# Patient Record
Sex: Male | Born: 1980 | Hispanic: Yes | Marital: Single | Smoking: Never smoker
Health system: Southern US, Community
[De-identification: ages and names within clinical notes are randomized; demographics above are authoritative.]

## PROBLEM LIST (undated history)

## (undated) DIAGNOSIS — E119 Type 2 diabetes mellitus without complications: Secondary | ICD-10-CM

## (undated) HISTORY — DX: Type 2 diabetes mellitus without complications: E11.9

---

## 2015-02-12 ENCOUNTER — Emergency Department (HOSPITAL_COMMUNITY): Payer: Self-pay

## 2015-02-12 ENCOUNTER — Encounter (HOSPITAL_COMMUNITY): Payer: Self-pay | Admitting: Emergency Medicine

## 2015-02-12 ENCOUNTER — Emergency Department (HOSPITAL_COMMUNITY)
Admission: EM | Admit: 2015-02-12 | Discharge: 2015-02-12 | Disposition: A | Discharge status: Home / Self Care | Payer: Self-pay | Attending: Emergency Medicine | Admitting: Emergency Medicine

## 2015-02-12 DIAGNOSIS — R739 Hyperglycemia, unspecified: Secondary | ICD-10-CM | POA: Insufficient documentation

## 2015-02-12 DIAGNOSIS — E860 Dehydration: Secondary | ICD-10-CM | POA: Insufficient documentation

## 2015-02-12 DIAGNOSIS — Z79899 Other long term (current) drug therapy: Secondary | ICD-10-CM | POA: Insufficient documentation

## 2015-02-12 DIAGNOSIS — M791 Myalgia: Secondary | ICD-10-CM | POA: Insufficient documentation

## 2015-02-12 DIAGNOSIS — R0789 Other chest pain: Secondary | ICD-10-CM | POA: Insufficient documentation

## 2015-02-12 LAB — URINALYSIS, ROUTINE W REFLEX MICROSCOPIC
Bilirubin Urine: NEGATIVE
HGB URINE DIPSTICK: NEGATIVE
Ketones, ur: NEGATIVE mg/dL
LEUKOCYTES UA: NEGATIVE
Nitrite: NEGATIVE
Protein, ur: NEGATIVE mg/dL
SPECIFIC GRAVITY, URINE: 1.043 — AB (ref 1.005–1.030)
Urobilinogen, UA: 0.2 mg/dL (ref 0.0–1.0)
pH: 5 (ref 5.0–8.0)

## 2015-02-12 LAB — CBC
HEMATOCRIT: 47.4 % (ref 39.0–52.0)
Hemoglobin: 16.9 g/dL (ref 13.0–17.0)
MCH: 30.1 pg (ref 26.0–34.0)
MCHC: 35.7 g/dL (ref 30.0–36.0)
MCV: 84.5 fL (ref 78.0–100.0)
Platelets: 178 10*3/uL (ref 150–400)
RBC: 5.61 MIL/uL (ref 4.22–5.81)
RDW: 12.3 % (ref 11.5–15.5)
WBC: 7 10*3/uL (ref 4.0–10.5)

## 2015-02-12 LAB — BASIC METABOLIC PANEL
Anion gap: 9 (ref 5–15)
BUN: 17 mg/dL (ref 6–20)
CO2: 25 mmol/L (ref 22–32)
CREATININE: 0.89 mg/dL (ref 0.61–1.24)
Calcium: 9.2 mg/dL (ref 8.9–10.3)
Chloride: 96 mmol/L — ABNORMAL LOW (ref 101–111)
GFR calc Af Amer: >60 mL/min (ref >60)
GFR calc non Af Amer: >60 mL/min (ref >60)
GLUCOSE: 559 mg/dL — AB (ref 70–99)
Potassium: 4 mmol/L (ref 3.5–5.1)
Sodium: 130 mmol/L — ABNORMAL LOW (ref 135–145)

## 2015-02-12 LAB — URINE MICROSCOPIC-ADD ON

## 2015-02-12 LAB — I-STAT TROPONIN, ED: Troponin i, poc: 0 ng/mL (ref 0.00–0.08)

## 2015-02-12 LAB — CBG MONITORING, ED: GLUCOSE-CAPILLARY: 268 mg/dL — AB (ref 70–99)

## 2015-02-12 MED ORDER — SODIUM CHLORIDE 0.9 % IV BOLUS (SEPSIS)
2000.0000 mL | Freq: Once | INTRAVENOUS | Status: AC
  Administered 2015-02-12: 2000 mL via INTRAVENOUS

## 2015-02-12 MED ORDER — INSULIN ASPART 100 UNIT/ML IV SOLN
10.0000 [IU] | Freq: Once | INTRAVENOUS | Status: AC
  Administered 2015-02-12: 10 [IU] via INTRAVENOUS
  Filled 2015-02-12: qty 1

## 2015-02-12 MED ORDER — METFORMIN HCL 500 MG PO TABS: 500.0000 mg | ORAL_TABLET | Freq: Two times a day (BID) | ORAL | Status: DC

## 2015-02-12 MED ORDER — KETOROLAC TROMETHAMINE 30 MG/ML IJ SOLN
30.0000 mg | Freq: Once | INTRAMUSCULAR | Status: AC
  Administered 2015-02-12: 30 mg via INTRAVENOUS
  Filled 2015-02-12: qty 1

## 2015-02-12 MED ORDER — IBUPROFEN 600 MG PO TABS: 600.0000 mg | ORAL_TABLET | Freq: Four times a day (QID) | ORAL | Status: DC | PRN

## 2015-02-12 NOTE — ED Provider Notes (Signed)
CSN: [id number]     Arrival date & time 02/12/15  0103 History  This chart was scribed for Loren Raceravid Ott Zimmerle, MD by Abel PrestoKara Demonbreun, ED Scribe. This patient was seen in room A04C/A04C and the patient's care was started at 3:43 AM.    Chief Complaint  Patient presents with  . Chest Pain    Patient is a 34 y.o. male presenting with chest pain. The history is provided by the patient. No language interpreter was used.  Chest Pain Associated symptoms: cough and nausea   Associated symptoms: no abdominal pain, no back pain, no dizziness, no fever, no headache, no numbness, no shortness of breath, not vomiting and no weakness    HPI Comments: Dennis Avila is a 34 y.o. male who presents to the Emergency Department complaining of chest pain with cough with onset yesterday. Pt denies pain without cough and deep inspiration. Pt notes associated fever 1 week ago which has resolved. Has had dry mouth, polydipsia, urinary frequency, and nausea. Pt denies recent surgeries and prolonged travel. Pt denies h/o DM and HTN but notes FHx of DM. He denies vomiting, SOB, and swelling or pain in bilateral LEs.    History reviewed. No pertinent past medical history. History reviewed. No pertinent past surgical history. No family history on file. History  Substance Use Topics  . Smoking status: Never Smoker   . Smokeless tobacco: Not on file  . Alcohol Use: No    Review of Systems  Constitutional: Negative for fever and chills.  Respiratory: Positive for cough. Negative for shortness of breath and wheezing.   Cardiovascular: Positive for chest pain (with cough). Negative for leg swelling.  Gastrointestinal: Positive for nausea. Negative for vomiting, abdominal pain and diarrhea.  Endocrine: Positive for polydipsia and polyuria.  Genitourinary: Positive for frequency.  Musculoskeletal: Positive for myalgias. Negative for back pain, neck pain and neck stiffness.  Skin: Negative for rash and wound.   Neurological: Negative for dizziness, syncope, weakness, light-headedness, numbness and headaches.  All other systems reviewed and are negative.     Allergies  Review of patient's allergies indicates no known allergies.  Home Medications   Prior to Admission medications   Medication Sig Start Date End Date Taking? Authorizing Provider  ibuprofen (ADVIL,MOTRIN) 600 MG tablet Take 1 tablet (600 mg total) by mouth every 6 (six) hours as needed. 02/12/15   Loren Raceravid Marrisa Kimber, MD  metFORMIN (GLUCOPHAGE) 500 MG tablet Take 1 tablet (500 mg total) by mouth 2 (two) times daily with a meal. 02/12/15   Loren Raceravid Delyle Weider, MD   BP 117/74 mmHg  Pulse 89  Temp(Src) 98.9 F (37.2 C) (Oral)  Resp 17  Ht 5\' 8"  (1.727 m)  Wt 245 lb (111.131 kg)  BMI 37.26 kg/m2  SpO2 97% Physical Exam  Constitutional: He is oriented to person, place, and time. He appears well-developed and well-nourished. No distress.  HENT:  Head: Normocephalic and atraumatic.  Mouth/Throat: Oropharynx is clear and moist.  Eyes: Conjunctivae and EOM are normal. Pupils are equal, round, and reactive to light.  Neck: Normal range of motion. Neck supple.  Cardiovascular: Normal rate and regular rhythm.   Pulmonary/Chest: Effort normal and breath sounds normal. No respiratory distress. He has no wheezes. He has no rales. He exhibits tenderness (chest pain is reproduced with palpation over the left side of the chest. There is no crepitance or deformity).  Abdominal: Soft. Bowel sounds are normal. He exhibits no distension and no mass. There is no tenderness. There is no rebound  and no guarding.  Musculoskeletal: Normal range of motion. He exhibits no edema or tenderness.  No calf swelling or tenderness.  Neurological: He is alert and oriented to person, place, and time.  Moves all extremities without deficit. Sensation is grossly intact.  Skin: Skin is warm and dry. No rash noted. No erythema.  Psychiatric: He has a normal mood and  affect. His behavior is normal.  Nursing note and vitals reviewed.   ED Course  Procedures (including critical care time) DIAGNOSTIC STUDIES: Oxygen Saturation is 96% on room air, normal by my interpretation.    COORDINATION OF CARE: 3:50 AM Discussed treatment plan with patient at beside, the patient agrees with the plan and has no further questions at this time.   Labs Review Labs Reviewed  BASIC METABOLIC PANEL - Abnormal; Notable for the following:    Sodium 130 (*)    Chloride 96 (*)    Glucose, Bld 559 (*)    All other components within normal limits  URINALYSIS, ROUTINE W REFLEX MICROSCOPIC - Abnormal; Notable for the following:    Specific Gravity, Urine 1.043 (*)    Glucose, UA >1000 (*)    All other components within normal limits  CBG MONITORING, ED - Abnormal; Notable for the following:    Glucose-Capillary 268 (*)    All other components within normal limits  CBC  URINE MICROSCOPIC-ADD ON  I-STAT TROPOININ, ED  CBG MONITORING, ED    Imaging Review Dg Chest 2 View  02/12/2015   CLINICAL DATA:  Nausea and vomiting and dizziness for 1 week. Left-sided chest pain.  EXAM: CHEST  2 VIEW  COMPARISON:  None.  FINDINGS: Normal cardiac silhouette. Low lung volumes. No effusion, infiltrate, or pneumothorax.  IMPRESSION: Low lung volumes.  No acute cardiopulmonary process.   Electronically Signed   By: Genevive Bi M.D.   On: 02/12/2015 02:13     EKG Interpretation   Date/Time:  Monday Feb 12 2015 01:10:10 EDT Ventricular Rate:  111 PR Interval:  160 QRS Duration: 86 QT Interval:  322 QTC Calculation: 437 R Axis:   86 Text Interpretation:  Sinus tachycardia Otherwise normal ECG Confirmed by  Cannon Arreola  MD, Oracio Galen ([id number]) on 02/12/2015 6:31:27 AM      MDM   Final diagnoses:  Left-sided chest wall pain  Hyperglycemia  Dehydration   I personally performed the services described in this documentation, which was scribed in my presence. The recorded information  has been reviewed and is accurate.  Patient presents with constant left-sided chest pain for one day. He has a normal troponin and an EKG without ischemic changes. His chest pain is completely reproduced with palpation of the left chest. No evidence of infection on x-ray. Patient states the pain is improved after Toradol. Was also noted to have significantly elevated blood sugar. Patient was tachycardic on presentation. He received 2 L normal saline and IV insulin in the emergency department. His tachycardia has resolved with IV fluids. His blood sugars down to the 200s. I have thoroughly educated the patient about the need for close follow-up through interpreter. We'll refer to the health and wellness clinic. Discuss with hospitalist Dr. Allena Katz. Recommend starting metformin 500 mg twice a day. Patient has been given thorough return precautions and is voiced understanding.   Loren Racer, MD 02/12/15 [phone]

## 2015-02-12 NOTE — ED Notes (Signed)
C/o nausea, dizziness, and not feeling well x 1 week.  Reports L sided chest pressure that started Sunday.  Denies sob.  Denies cough.

## 2015-02-12 NOTE — Discharge Instructions (Signed)
Make sure you are drinking plenty of fluids. Take ibuprofen as needed for chest wall pain. Call and make an appointment to follow-up with the health and wellness clinic. If you have worsening symptoms including worsening chest pain, shortness of breath, fever, lightheadedness, persistent vomiting, abdominal pain or any concerns return immediately to the emergency department.    Dolor de la pared torcica (Chest Wall Pain) Dolor en la pared torcica es dolor en o alrededor de los huesos y msculos de su pecho. Podrn pasar hasta 6 semanas hasta que comience a mejorar. Puede demorar ms tiempo si es fsicamente activo en su Aleen Campitrabajo y Toledoactividades.  CAUSAS  El dolor en el pecho puede aparecer sin motivo. No obstante, algunas causas pueden ser:   Neomia DearUna enfermedad viral como la gripe.  Traumatismos.  Tos.  La prctica de ejercicios.  Artritis.  Fibromialgia  Culebrilla. INSTRUCCIONES PARA EL CUIDADO DOMICILIARIO  Evite hacer actividad fsica extenuante. Trate de no esforzarse o Electrical engineerrealizar actividades que le causen dolor. Aqu se incluyen las actividades en las que [place]ausa los msculos del trax, los abdominales y los msculos laterales, especialmente si debe levantar objetos pesados.  Aplique hielo sobre la zona dolorida.  Ponga el hielo en una bolsa plstica.  Colquese una toalla entre la piel y la bolsa de hielo.  Deje la bolsa de hielo durante 15 a 20 minutos por hora, durante los primeros [address]  [address].  Utilice los medicamentos de venta libre o de prescripcin para Chief Technology Officerel dolor, Environmental health practitionerel malestar o la Milbridgefiebre, segn se lo indique el profesional que lo asiste. SOLICITE ATENCIN MDICA DE INMEDIATO SI:  El dolor aumenta o siente muchas molestias.  Tiene fiebre.  El dolor de Braddyvillepecho empeora.  Desarrolla nuevos e inexplicables sntomas.  Tiene nuseas o vmitos.  Berenice Primasranspira o se siente mareado.  Tiene tos con flema (esputo), o tose con sangre. EST SEGURO QUE:   Comprende las instrucciones para  el alta mdica.  Controlar su enfermedad.  Solicitar atencin mdica de inmediato segn las indicaciones. Document Released: 11/10/2006 Document Revised: 12/22/2011 [facility] Patient Information 2015 [place], [place]. This information is not intended to replace advice given to you by your health care provider. Make sure you discuss any questions you have with your health care provider.  Deshidratacin, Adulto (Dehydration, Adult) Se denomina deshidratacin cuando se pierden ms lquidos de los que se ingieren. Los rganos Navistar International Corporationvitales como los riones, el cerebro y el corazn no pueden funcionar sin la [place] Jan Mayen Islandsadecuada cantidad de lquidos y Airline pilotsales. Cualquier prdida de lquidos del organismo puede causar deshidratacin.  CAUSAS  Vmitos.  Diarrea.  Sudoracin excesiva.  Eliminacin excesiva de orina.  Grant RutsFiebre. SNTOMAS Deshidratacin leve  Sed.  Labios resecos.  Sequedad leve de la mucosa bucal. Deshidratacin moderada   La boca est muy seca.  Ojos hundidos.  La piel no vuelve rpidamente a su lugar cuando se suelta luego de pellizcarla ligeramente.  Larose Kellsrina oscura y disminucin de la produccin de Comor[place]orina.  Disminucin de la cantidad de lgrimas.  Dolor de Turkmenistancabeza. Deshidratacin grave  La boca est muy seca.  Sed extrema.  Pulso rpido y dbil (ms de 100 por minuto en reposo).  Manos y pies fros.  Prdida de la capacidad para transpirar, independientemente del calor y la temperatura.  Respiracin rpida.  Labios azulados.  Confusin y Health and safety inspectoraletargamiento.  Dificultad para despertarse.  Poca produccin de [place]or[place].  Falta de lgrimas. DIAGNSTICO El profesional diagnosticar deshidratacin basndose en los sntomas y el examen fsico. Las pruebas de sangre y [place]orina ayudarn a Astronomerconfirmar  el diagnstico. La evaluacin diagnstica suele identificar tambin las causas de la deshidratacin. TRATAMIENTO El tratamiento de la deshidratacin leve o moderada generalmente puede  hacerse en el hogar mediante el aumento de la cantidad de los lquidos que se beben. Es mejor beber pequeas cantidades de lquidos con mayor frecuencia. Beber demasiado de una sola vez puede hacer que el vmito empeore. Siga las instrucciones para el cuidado en el hogar que se indican a continuacin.  La deshidratacin grave debe tratarse en el hospital en el que probablemente le administren lquidos por va intravenosa (IV) que contienen agua y Customer service manager.  INSTRUCCIONES PARA EL CUIDADO DOMICILIARIO  Pida instrucciones especficas a su mdico con respecto a la rehidratacin.  Debe ingerir gran cantidad de lquido para mantener la orina de tono claro o color amarillo plido.  Beba pequeas cantidades con frecuencia si tiene nuseas y vmitos.  Alimntese como lo hace normalmente.  Evite:  Alimentos o bebidas que Energy manager.  Gaseosas.  Jugos.  Lquidos muy calientes o fros.  Bebidas con cafena.  Alimentos muy grasos.  Alcohol.  Lowella Dell demasiado a la vez.  Postres de gelatina.  Lave bien sus manos para evitar las bacterias o los virus se diseminen.  Slo tome medicamentos de venta libre o recetados para Primary school teacher, las molestias o bajar la fiebre segn las indicaciones de su mdico.  Consulte a su mdico si puede seguir tomando todos sus medicamentos recetados o de Sales promotion account executive.  Concurra puntualmente a las citas de control con el mdico. SOLICITE ATENCIN MDICA SI:  Tiene dolor abdominal y este aumenta o permanece en un rea (se localiza).  Aparece una erupcin, rigidez en el cuello o dolor de cabeza intensos.  Est irritable, somnoliento o le cuesta despertarse.  Se siente dbil, mareado tiene mucha sed. SOLICITE ATENCIN MDICA DE INMEDIATO SI:  No puede retener lquidos o empeora a pesar del tratamiento.  Tiene episodios frecuentes de vmitos o diarrea.  Observa sangre o una sustancia verde (bilis) en el vmito.  Maxie Better en la materia fecal, o las heces son negras y de aspecto alquitranado.  No ha orinado durante 6 a 8 horas, o slo ha [place] cantidad [place] de [place].  Tiene fiebre.  Se desmaya. ASEGRESE QUE:  Comprende estas instrucciones.  Controlar su enfermedad.  Solicitar ayuda inmediatamente si no mejora o si empeora. Document Released: 09/29/2005 Document Revised: 12/22/2011 [facility] Patient Information 2015 [place], [place]. This information is not intended to replace advice given to you by your health care provider. Make sure you discuss any questions you have with your health care provider.  Hiperglucemia (Hyperglycemia) La hiperglucemia ocurre cuando la glucosa (azcar) en su sangre est demasiado elevada. Puede suceder por varias razones, pero a menudo ocurre en personas que no saben que tienen diabetes o no la controlan adecuadamente.  CAUSAS Tanto si tiene diabetes como si no, existen otras causas para la hiperglucemia. La hiperglucemia puede producirse cuando tiene diabetes, pero tambin puede presentarse en otras situaciones de las que podra no ser consciente, como por ejemplo: Diabetes  Si tiene diabetes y tiene problemas para controlar su glucosa en sangre, la hiperglucemia podra producirse debido a las siguientes razones:  No seguir Press photographer.  No tomar los medicamentos para la diabetes o tomarlos de forma inadecuada.  Realizar menos ejercicio del que normalmente hace.  Estar enfermo. Prediabetes  Esto no puede ignorarse. Antes de que la persona presente diabetes de tipo 2, casi siempre hay "  prediabetes". Esto ocurre cuando su glucosa en sangre es mayor que lo normal, pero no lo suficiente como para diagnosticar diabetes. La investigacin ha demostrado que algunos daos al cuerpo de Air cabin crew, en especial los del corazn y el sistema circulatorio, podran haber ocurrido durante el periodo de prediabetes. Si controla la glucosa en sangre  cuando tiene prediabetes, podr retardar o evitar que se desarrrolle la diabetes tipo 2. El estrs  Si tiene diabetes, deber hacer una dieta, tomar medicamentos orales o insulina para [place]. Sin embargo, Clinical research associate que la glucosa en sangre es mayor que lo normal en el hospital tenga o no diabetes. Cientficamente se lo denomina "hiperglucemia por estrs". El estrs puede elevar su glucosa en sangre. Esto ocurre porque el organismo genera hormonas en los momentos de estrs. Si el estrs ha [employer] causa del alto nivel de glucosa en [place], [place] mdico podr Education officer, environmental un seguimiento de [place] regular. Feliberto Harts, podr asegurarse de que la hiperglucemia no empeora o progresa hacia diabetes. Esteroides  Los esteroides son medicamentos que actan en la infeccin que ataca al sistema inmunolgico para bloquear la inflamacin o la infeccin. Un efecto secundario puede ser el aumento de glucosa en [place]. Muchas personas pueden producir la suficiente insulina extra para este aumento, pero aquellos que no pueden, los esteroides pueden [employer] niveles sean an [place]. No es inusual que los tratamientos con esteorides "destapen" una diabetes que se est desarrollando. No siempre es posible determinar si la hiperglucemia desaparecer una vez que se detenga el consumo de esteroides. A veces se realiza un anlisis de sangre especial denominado A1c para determinar si la glucosa en sangre se ha elevado antes de comenzar con el consumo de esteroides. SNTOMAS  Sed.  Necesidad frecuente de Geographical information systems officer.  [employer].  Visin borrosa.  Cansancio o fatiga.  Debilidad.  Somnolencia.  Hormigueo en el pie o pierna. DIAGNSTICO El diagnstico se realiza mediante el control de la glucosa en sangre de una o varias de las siguientes maneras:  Anlisis A1c. Es una sustancia qumica que se encuentra en la [place].  Control de glucosa en sangre con tiras de prueba.  Resultados de  laboratorio. TRATAMIENTO Primero, es importante conocer la causa de la hiperglucemia antes de tratarla. El tratamiento puede ser el siguiente, [place] pueden ser otros:  Educacin  Cambios o ajustes en los medicamentos.  Cambios o ajustes en el plan de alimentacin.  Tratamiento por enfermedades, infecciones, etc.  Control de glucosa en sangre ms frecuente.  Cambios en el plan de ejercicios.  Disminucin o interrupcin del consumo de esteroides.  Cambios en el estilo de vida. INSTRUCCIONES PARA EL CUIDADO DOMICILIARIO  Contrlese la glucosa en sangre, como se lo indicaron.  Haga ejercicios regularmente. El profesional que lo asiste le dar instrucciones relacionadas con el ejercicio fsico. La prediabetes que es consecuencia de situaciones de estrs, puede mejorar con la actividad fsica.  Consuma alimentos saludables y balanceados. Coma a menudo y de [place] regular, en momentos fijos. El profesional o el nutricionista le dar una dieta especial para controlar su ingestin de azcar.  Mantener su peso ideal es importante. Si lo necesita, perder un poco de peso, como 5  7 Kg. puede ser beneficioso para [employer] niveles de Production assistant, radio. SOLICITE ATENCIN MDICA SI:  Tiene preguntas relacionadas con los medicamentos, la actividad o la dieta.  Contina teniendo sntomas (como mucha sed, deseos intensos de Geographical information systems officer o aumento de peso) SOLICITE ATENCIN MDICA DE INMEDIATO SI:  Vomita o  tiene diarrea.  Su respiracin huele frutal.  La frecuencia respiratoria es ms rpida o ms lenta.  Est somnoliento o incoherente.  Siente adormecimiento, hormigueos o Tax adviser o en las manos.  Siente dolor en el pecho.  Sus sntomas empeoran aunque haya seguido las indicaciones de su mdico.  Tiene otras preguntas o preocupaciones. Document Released: 09/29/2005 Document Revised: 12/22/2011 [facility] Patient Information 2015 [place], [place]. This information is not intended  to replace advice given to you by your health care provider. Make sure you discuss any questions you have with your health care provider.

## 2015-03-14 ENCOUNTER — Ambulatory Visit (INDEPENDENT_AMBULATORY_CARE_PROVIDER_SITE_OTHER): Payer: Self-pay | Admitting: Physician Assistant

## 2015-03-14 ENCOUNTER — Encounter: Payer: Self-pay | Admitting: Physician Assistant

## 2015-03-14 VITALS — BP 126/80 | HR 84 | Temp 98.7°F | Resp 18 | Ht 69.5 in | Wt 247.0 lb

## 2015-03-14 DIAGNOSIS — IMO0002 Reserved for concepts with insufficient information to code with codable children: Secondary | ICD-10-CM

## 2015-03-14 DIAGNOSIS — E1165 Type 2 diabetes mellitus with hyperglycemia: Secondary | ICD-10-CM

## 2015-03-14 DIAGNOSIS — E784 Other hyperlipidemia: Secondary | ICD-10-CM

## 2015-03-14 DIAGNOSIS — E785 Hyperlipidemia, unspecified: Secondary | ICD-10-CM

## 2015-03-14 LAB — LIPID PANEL
CHOL/HDL RATIO: 6.2 ratio
Cholesterol: 180 mg/dL (ref 0–200)
HDL: 29 mg/dL — ABNORMAL LOW (ref >=40)
LDL Cholesterol: 111 mg/dL — ABNORMAL HIGH (ref 0–99)
Triglycerides: 202 mg/dL — ABNORMAL HIGH (ref <150)
VLDL: 40 mg/dL (ref 0–40)

## 2015-03-14 LAB — POCT GLYCOSYLATED HEMOGLOBIN (HGB A1C): HEMOGLOBIN A1C: 10

## 2015-03-14 MED ORDER — METFORMIN HCL 1000 MG PO TABS: 1000.0000 mg | ORAL_TABLET | Freq: Two times a day (BID) | ORAL | Status: DC

## 2015-03-14 MED ORDER — PRAVASTATIN SODIUM 40 MG PO TABS: 40.0000 mg | ORAL_TABLET | Freq: Every day | ORAL | Status: DC

## 2015-03-14 NOTE — Progress Notes (Deleted)
   Name: Dennis Avila MRN: [id number] DOB: 02/09/1981  Subjective HPI: Pt presents to clinic today for   Patient Active Problem List   Diagnosis Date Noted  . DM (diabetes mellitus), type 2, uncontrolled 03/14/2015   Current Outpatient Prescriptions on File Prior to Visit  Medication Sig Dispense Refill  . metFORMIN (GLUCOPHAGE) 500 MG tablet Take 1 tablet (500 mg total) by mouth 2 (two) times daily with a meal. 60 tablet 0  . ibuprofen (ADVIL,MOTRIN) 600 MG tablet Take 1 tablet (600 mg total) by mouth every 6 (six) hours as needed. (Patient not taking: Reported on 03/14/2015) 30 tablet 0   No current facility-administered medications on file prior to visit.   History reviewed. No pertinent past medical history.   No Known Allergies  ROS  Objective: BP 126/80 mmHg  Pulse 84  Temp(Src) 98.7 F (37.1 C) (Oral)  Resp 18  Ht 5' 9.5" (1.765 m)  Wt 247 lb (112.038 kg)  BMI 35.96 kg/m2  SpO2 98%  Physical Exam  Constitutional: He appears well-developed and well-nourished.    Assessment and Plan: DM (diabetes mellitus), type 2, uncontrolled - Plan: Lipid panel, POCT glycosylated hemoglobin (Hb A1C), HM DIABETES FOOT EXAM  Duquan Gillooly, PA-S [facility] 03/14/2015 10:00 AM

## 2015-03-14 NOTE — Patient Instructions (Signed)
Diabetes y actividad fsica (Diabetes and Exercise) Hacer actividad fsica con regularidad es muy importante. No se trata solo de perder peso. Tiene muchos otros beneficios, como por ejemplo:  Mejorar el estado fsico, la flexibilidad y la resistencia.  Aumenta la densidad sea.  Ayuda a controlar el peso.  Disminuye la grasa corporal.  Aumenta la fuerza muscular.  Reduce el estrs y las tensiones.  Mejora el estado de salud general. Las personas diabticas que realizan actividad fsica tienen beneficios adicionales debido al ejercicio:  Reduce el apetito.  El organismo mejora el uso del azcar (glucosa) de la sangre.  Ayuda a disminuir o controlar la glucosa en la sangre.  Disminuye la presin arterial.  Ayuda a disminuir los lpidos en la sangre (colesterol y triglicridos).  El organismo mejora el uso de la insulina porque:  Aumenta la sensibilidad del organismo a la insulina.  Reduce las necesidades de insulina del organismo.  Disminuye el riesgo de enfermedad cardaca por la actividad fsica ya que  disminuye el colesterol y tambin los triglicridos.  Aumenta los niveles de colesterol bueno (como las lipoprotenas de alta densidad [HDL]) en el organismo.  Disminuye los niveles de glucosa en la sangre. SU PLAN DE ACTIVIDAD  Elija una actividad que disfrute y establezca objetivos realistas. Su mdico o educador en diabetes podrn ayudarlo a encontrar una actividad que lo beneficie. Haga ejercicio regularmente como se lo haya indicado el mdico. Esto incluye:  Hacer entrenamiento de resistencia dos veces a la semana, como flexiones, sentadillas, levantar peso o usar bandas de resistencia.  Practicar 150minutos de ejercicios cardiovasculares cada semana, como caminar, correr o hacer algn deporte.  Mantenerse activo y no permanecer inactivo durante ms de 90minutos seguidos. Los perodos cortos de actividad tambin son beneficiosos. Tres sesiones de 10minutos a  lo largo del da son tan beneficiosas como una sola sesin de 30minutos. Estas son algunas ideas para los ejercicios:  Lleve a pasear el perro.  Utilice las escaleras en lugar del ascensor.  Baile su cancin favorita.  Haga los ejercicios de un video de ejercicios.  Haga sus ejercicios favoritos con un amigo. RECOMENDACIONES PARA REALIZAR EJERCICIOS CUANDO SE TIENE DIABETES TIPO 1 O TIPO 2   Controle la glucosa en la sangre antes de comenzar. Si el nivel de glucosa en la sangre es de ms de 240 mg/dl, controle las cetonas en la orina. No haga actividad fsica si hay cetonas.  Evite inyectarse insulina en las zonas del cuerpo que ejercitar. Por ejemplo, evite inyectarse insulina en:  Los brazos, si juega al tenis.  Las piernas, si corre.  Lleve un registro de:  Los alimentos que consume antes y despus de hacer el ejercicio.  Los momentos esperables de picos de accin de la insulina.  Los niveles de glucosa en la sangre antes y despus de hacer ejercicios.  El tipo y cantidad de actividad fsica que realiza.  Revise los registros con su mdico. El mdico lo ayudar a desarrollar pautas para ajustar la cantidad de alimento y las cantidades de insulina antes y despus de hacer ejercicios.  Si toma insulina o agentes hipoglucemiantes por va oral, observe si hay signos y sntomas de hipoglucemia. Entre los que se incluyen:  Mareos.  Temblores.  Sudoracin.  Escalofros.  Confusin.  Beba gran cantidad de agua mientras hace ejercicios para evitar la deshidratacin o los golpes de calor. Durante la actividad fsica se pierde agua corporal que se debe reponer.  Comente con su mdico antes de comenzar un programa de   actividad fsica para verificar que sea seguro para usted. Recuerde, cualquier actividad es mejor que ninguna. Document Released: 10/19/2007 Document Revised: 02/13/2014 ExitCare Patient Information 2015 [employer]. This information is not intended to  replace advice given to you by your health care provider. Make sure you discuss any questions you have with your health care provider.  

## 2015-03-14 NOTE — Progress Notes (Signed)
Dennis Avila  MRN: [id number] DOB: 12/27/1980  Subjective:  Pt presents to clinic for a diabetes check.  He was seen in the ED on 5/2 due to dizziness and he was diagnosed with DM and started on Metformin 500mg  bid.  He has felt much better since then.  He has been tolerating the medication ok.  He does not check his glucose.  Diet is poor, a lot of juice and carbs based diet.  Almost no exercise.    Girlfried in room who interprets for patient, they do not live together.  Patient Active Problem List   Diagnosis Date Noted  . DM (diabetes mellitus), type 2, uncontrolled 03/14/2015   History reviewed. No pertinent past medical history.  The patient has a current medication list which includes the following prescription(s): metformin and ibuprofen.  No Known Allergies  Review of Systems  Cardiovascular: Negative for chest pain.  Neurological: Negative for dizziness and sensory change.    Objective:  BP 126/80 mmHg  Pulse 84  Temp(Src) 98.7 F (37.1 C) (Oral)  Resp 18  Ht 5' 9.5" (1.765 m)  Wt 247 lb (112.038 kg)  BMI 35.96 kg/m2  SpO2 98%   Physical Exam  Constitutional: He is oriented to person, place, and time and well-developed, well-nourished, and in no distress.  BP 126/80 mmHg  Pulse 84  Temp(Src) 98.7 F (37.1 C) (Oral)  Resp 18  Ht 5' 9.5" (1.765 m)  Wt 247 lb (112.038 kg)  BMI 35.96 kg/m2  SpO2 98%   HENT:  Head: Normocephalic and atraumatic.  Right Ear: External ear normal.  Left Ear: External ear normal.  Eyes: Conjunctivae are normal.  Neck: Normal range of motion.  Cardiovascular: Normal rate, regular rhythm and normal heart sounds.   Pulmonary/Chest: Effort normal and breath sounds normal.  Neurological: He is alert and oriented to person, place, and time.  Skin: Skin is warm and dry.  Diabetic Foot Exam - Simple   Simple Foot Form  Diabetic Foot exam was performed with the following findings:  Yes  03/14/2015  9:59 AM  Visual Inspection    No deformities, no ulcerations, no other skin breakdown bilaterally:  Yes  Sensation Testing  Intact to touch and monofilament testing bilaterally:  Yes  Pulse Check  Posterior Tibialis and Dorsalis pulse intact bilaterally:  Yes  Comments      Psychiatric: Mood, memory, affect and judgment normal.   Results for orders placed or performed in visit on 03/14/15  Lipid panel  Result Value Ref Range   Cholesterol 180 0 - 200 mg/dL   Triglycerides 409202 (H) <150 mg/dL   HDL 29 (L) >=81>=40 mg/dL   Total CHOL/HDL Ratio 6.2 Ratio   VLDL 40 0 - 40 mg/dL   LDL Cholesterol 191111 (H) 0 - 99 mg/dL  POCT glycosylated hemoglobin (Hb A1C)  Result Value Ref Range   Hemoglobin A1C 10.0     Assessment and Plan :  DM (diabetes mellitus), type 2, uncontrolled - Plan: Lipid panel, POCT glycosylated hemoglobin (Hb A1C), HM DIABETES FOOT EXAM, metFORMIN (GLUCOPHAGE) 1000 MG tablet  We will recheck in 3 months.  We talked about ways to change his diet and things to avoid or at least cut down.  He drinks a lot of juice and eats sweets.  He was not interested in DM nutrition therapy at this time.  He understands how to increase his medications.  Due to his low HDL we will go ahead and start a statin.  I did not start an ACE today due to his good BP and I did not want to overwhelm the patient with his new diagnosis.  This was the 1st time he was educated about diabetes and I expect his numbers to be improved at his next visit with the education we were able to do today.  Benny Lennert PA-C  [facility] 03/14/2015 5:12 PM

## 2015-04-05 ENCOUNTER — Ambulatory Visit: Payer: Self-pay | Attending: Internal Medicine

## 2015-06-12 ENCOUNTER — Ambulatory Visit (INDEPENDENT_AMBULATORY_CARE_PROVIDER_SITE_OTHER): Payer: Self-pay | Admitting: Family Medicine

## 2015-06-12 VITALS — BP 120/78 | HR 95 | Temp 98.3°F | Resp 18 | Ht 69.0 in | Wt 258.8 lb

## 2015-06-12 DIAGNOSIS — E784 Other hyperlipidemia: Secondary | ICD-10-CM

## 2015-06-12 DIAGNOSIS — Z2821 Immunization not carried out because of patient refusal: Secondary | ICD-10-CM

## 2015-06-12 DIAGNOSIS — E1165 Type 2 diabetes mellitus with hyperglycemia: Secondary | ICD-10-CM

## 2015-06-12 DIAGNOSIS — E785 Hyperlipidemia, unspecified: Secondary | ICD-10-CM

## 2015-06-12 DIAGNOSIS — E119 Type 2 diabetes mellitus without complications: Secondary | ICD-10-CM

## 2015-06-12 LAB — COMPLETE METABOLIC PANEL WITH GFR
ALT: 90 U/L — ABNORMAL HIGH (ref 9–46)
AST: 41 U/L — ABNORMAL HIGH (ref 10–40)
Albumin: 4.7 g/dL (ref 3.6–5.1)
BUN: 17 mg/dL (ref 7–25)
CO2: 25 mmol/L (ref 20–31)
Chloride: 103 mmol/L (ref 98–110)
GFR, Est Non African American: >89 mL/min (ref >=60)
Glucose, Bld: 134 mg/dL — ABNORMAL HIGH (ref 65–99)
Potassium: 4 mmol/L (ref 3.5–5.3)
Sodium: 138 mmol/L (ref 135–146)
Total Bilirubin: 0.5 mg/dL (ref 0.2–1.2)

## 2015-06-12 LAB — COMPLETE METABOLIC PANEL WITHOUT GFR
Alkaline Phosphatase: 70 U/L (ref 40–115)
Calcium: 9.5 mg/dL (ref 8.6–10.3)
Creat: 0.85 mg/dL (ref 0.60–1.35)
GFR, Est African American: >89 mL/min (ref >=60)
Total Protein: 7.4 g/dL (ref 6.1–8.1)

## 2015-06-12 LAB — POCT GLYCOSYLATED HEMOGLOBIN (HGB A1C): Hemoglobin A1C: 6.7

## 2015-06-12 MED ORDER — PRAVASTATIN SODIUM 40 MG PO TABS: 40.0000 mg | ORAL_TABLET | Freq: Every day | ORAL | Status: DC

## 2015-06-12 MED ORDER — METFORMIN HCL 1000 MG PO TABS: 1000.0000 mg | ORAL_TABLET | Freq: Two times a day (BID) | ORAL | Status: DC

## 2015-06-12 NOTE — Patient Instructions (Signed)
Diabetes y normas bsicas de atencin mdica (Diabetes and Standards of Medical Care) La diabetes es una enfermedad complicada. El equipo que trate su diabetes deber incluir un nutricionista, un enfermero, un educador para la diabetes, un oftalmlogo y ms. Para que todas las personas conozcan sobre su enfermedad y para que los pacientes tengan los cuidados que necesitan, se crearon las siguientes normas bsicas para un mejor control. A continuacin se indican los estudios, vacunas, medicamentos, educacin y planes que necesitar. Prueba de HbA1c Esta prueba muestra cmo ha sido controlada su glucosa en los ltimos 2 o 3 meses. Se utiliza para verificar si el plan de control de la diabetes debe ser ajustado.   Hgalos al menos 2 veces al ao si cumple los objetivos del tratamiento.  Si le han cambiado el tratamiento o si no cumple con los objetivos del tratamiento, debe hacerlo 4 veces al ao. Control de la presin arterial.  Hgalas en cada visita mdica de rutina. El objetivo es tener menos de 140/90 mm Hg en la mayora de las personas, pero 130/80 mm Hg en algunos casos. Consulte a su mdico acerca de su objetivo. Examen dental.  Concurra regularmente a las visitas de control con el dentista. Examen ocular.  Si le diagnosticaron diabetes tipo 1 siendo un nio, debe hacerse estudios al llegar a los 10 aos o ms y si ha sufrido de diabetes durante 3 a 5 aos. Se recomienda hacer anualmente los exmenes oculares despus de ese examen inicial.  Si le diagnosticaron diabetes tipo 1 siendo adulto, hgase un examen dentro de los 5 aos del diagnstico y luego una vez por ao.  Si le diagnosticaron diabetes tipo 2, hgase un estudio lo antes posible despus del diagnstico y luego una vez por ao. Examen de los pies  Se har una inspeccin visual en cada visita mdica de rutina. Estos controles observarn si hay cortes, lesiones u otros problemas en los pies.  Una vez por ao debe hacerse un  examen ms intensivo. Este examen incluye la inspeccin visual y una evaluacin de los pulsos del pie y la sensibilidad.  Contrlese los pies todas las noches para ver si hay cortes, lesiones u otros problemas. Comunquese con su mdico si observa que no se curan. Estudio de la funcin renal (microalbmina en orina)  Debe realizarse una vez por ao.  Diabetes tipo 1: La primera prueba se realiza a los 5 aos despus del diagnstico.  Diabetes tipo2: La primera prueba se realiza en el momento del diagnstico.  La creatinina srica y el ndice de filtracin glomerular estimada (eGFR, por sus siglas en ingls) se realizan una vez por ao para informar el nivel de enfermedad renal crnica, si la hubiera. Perfil lipdico (colesterol, HDL, LDL, triglicridos).  La mayora de las personas lo hacen cada 5 aos.  En relacin al LDL, el objetivo es tener menos de 100 mg/dl. Si tiene alto riesgo, el objetivo es tener menos de 70 mg/dl.  En relacin al HDL, el objetivo es tener entre 40 y 50 mg/dl para los hombres y entre 50 y 60 mg/dl para las mujeres. Un nivel de colesterol HDL de 60 mg/dl o superior da una cierta proteccin contra la enfermedad cardaca.  En relacin a los triglicridos, el objetivo es tener menos de 150 mg/dl. Vacuna contra la gripe, contra el neumococo y contra la hepatitis B  Se recomienda aplicarse la vacuna contra la gripe anualmente.  Se recomienda que las personas mayores de 65aos con diabetes se apliquen la   vacuna contra la neumona. En algunos casos, pueden administrarse dos inyecciones separadas. Pregntele al mdico si tiene la vacuna contra la neumona al da.  Tambin se recomienda la aplicacin de la vacuna contra la hepatitis B en los adultos con diabetes. Educacin para el autocontrol de la diabetes  Recomendaciones al momento del diagnstico y los controles segn sea necesario. Plan de tratamiento  Su plan de tratamiento ser revisado en cada visita  mdica. Document Released: 12/24/2009 Document Revised: 02/13/2014 ExitCare Patient Information 2015 [employer]. This information is not intended to replace advice given to you by your health care provider. Make sure you discuss any questions you have with your health care provider.  

## 2015-06-12 NOTE — Progress Notes (Signed)
Chief Complaint:  Chief Complaint  Patient presents with  . Diabetes Check  . Medication Refill    Metformin    HPI: Dennis Avila is a 34 y.o. male who reports to [facility] today complaining of diabetes checkup. He is doing well . NO complaints. Eating less, same foods, no sodas, not exercising except at work. Not utd on eye exam  Denies neuropathy, CP, SOB, hypoglycemia, palpitations, presyncope/syncope, polydipsia, polyuria, nausea, vomiting, abd pain  Wt Readings from Last 3 Encounters:  06/12/15 258 lb 12.8 oz (117.391 kg)  03/14/15 247 lb (112.038 kg)  02/12/15 245 lb (111.131 kg)   Lab Results  Component Value Date   HGBA1C 6.7 06/12/2015   HGBA1C 10.0 03/14/2015   Lab Results  Component Value Date   LDLCALC 111* 03/14/2015   CREATININE 0.89 02/12/2015      Past Medical History  Diagnosis Date  . Diabetes mellitus without complication    History reviewed. No pertinent past surgical history. Social History   Social History  . Marital Status: Single    Spouse Name: N/A  . Number of Children: N/A  . Years of Education: N/A   Occupational History  . Psychiatric nurse    Social History Main Topics  . Smoking status: Never Smoker   . Smokeless tobacco: None  . Alcohol Use: No  . Drug Use: No  . Sexual Activity: Not Asked   Other Topics Concern  . None   Social History Narrative   Psychiatric nurse - Sports coach   Family History  Problem Relation Age of Onset  . Diabetes Mother    No Known Allergies Prior to Admission medications   Medication Sig Start Date End Date Taking? Authorizing Provider  metFORMIN (GLUCOPHAGE) 1000 MG tablet Take 1 tablet (1,000 mg total) by mouth 2 (two) times daily with a meal. 03/14/15  Yes Morrell Riddle, PA-C  pravastatin (PRAVACHOL) 40 MG tablet Take 1 tablet (40 mg total) by mouth daily. 03/14/15  Yes Morrell Riddle, PA-C  ibuprofen (ADVIL,MOTRIN) 600 MG tablet Take 1 tablet (600 mg total) by mouth every 6 (six)  hours as needed. Patient not taking: Reported on 03/14/2015 02/12/15   Loren Racer, MD     ROS: The patient denies fevers, chills, night sweats, unintentional weight loss, chest pain, palpitations, wheezing, dyspnea on exertion, nausea, vomiting, abdominal pain, dysuria, hematuria, melena, numbness, weakness, or tingling.   All other systems have been reviewed and were otherwise negative with the exception of those mentioned in the HPI and as above.    PHYSICAL EXAM: Filed Vitals:   06/12/15 0823  BP: 120/78  Pulse: 95  Temp: 98.3 F (36.8 C)  Resp: 18   Body mass index is 38.2 kg/(m^2).   General: Alert, no acute distress HEENT:  Normocephalic, atraumatic, oropharynx patent. EOMI, PERRLA Fundo exam nor,al Cardiovascular:  Regular rate and rhythm, no rubs murmurs or gallops.  No Carotid bruits, radial pulse intact. No pedal edema.  Respiratory: Clear to auscultation bilaterally.  No wheezes, rales, or rhonchi.  No cyanosis, no use of accessory musculature Abdominal: No organomegaly, abdomen is soft and non-tender, positive bowel sounds. No masses. Skin: No rashes. Neurologic: Facial musculature symmetric. Microfilament exam normal Psychiatric: Patient acts appropriately throughout our interaction. Lymphatic: No cervical or submandibular lymphadenopathy Musculoskeletal: Gait intact. No edema, tenderness   LABS: Results for orders placed or performed in visit on 06/12/15  POCT glycosylated hemoglobin (Hb A1C)  Result Value Ref Range  Hemoglobin A1C 6.7      EKG/XRAY:   Primary read interpreted by Dr. Conley Rolls at [facility].   ASSESSMENT/PLAN: Encounter Diagnoses  Name Primary?  . Type 2 diabetes mellitus without complication Yes  . Dyslipidemia (high LDL; low HDL)   . Influenza vaccination declined    34 y/o hispanic male with DM, slightly elevated cholesterol who is here for med refills, doing well on meds without SE, compliant. Hba1c imrpoved, will return in 3 months for  fasting blood work Recommend : ADA diet, BP goal <140/90, daily foot exams, tobacco cessation if smoking, annual eye exam, annual flu vaccine, PNA vaccine if age and time appropriate.  Refill meds for 9 month, labs pending Fu in 3 months  Gross sideeffects, risk and benefits, and alternatives of medications d/w patient. Patient is aware that all medications have potential sideeffects and we are unable to predict every sideeffect or drug-drug interaction that may occur.  Amira Podolak DO  06/12/2015 9:21 AM

## 2015-06-14 ENCOUNTER — Ambulatory Visit: Payer: Self-pay | Admitting: Physician Assistant

## 2015-06-15 ENCOUNTER — Encounter: Payer: Self-pay | Admitting: Family Medicine

## 2016-01-22 ENCOUNTER — Encounter (HOSPITAL_COMMUNITY): Payer: Self-pay | Admitting: Emergency Medicine

## 2016-01-22 DIAGNOSIS — Z79899 Other long term (current) drug therapy: Secondary | ICD-10-CM | POA: Insufficient documentation

## 2016-01-22 DIAGNOSIS — R1030 Lower abdominal pain, unspecified: Secondary | ICD-10-CM | POA: Insufficient documentation

## 2016-01-22 DIAGNOSIS — A6002 Herpesviral infection of other male genital organs: Secondary | ICD-10-CM | POA: Insufficient documentation

## 2016-01-22 DIAGNOSIS — E119 Type 2 diabetes mellitus without complications: Secondary | ICD-10-CM | POA: Insufficient documentation

## 2016-01-22 DIAGNOSIS — Z7984 Long term (current) use of oral hypoglycemic drugs: Secondary | ICD-10-CM | POA: Insufficient documentation

## 2016-01-22 LAB — URINALYSIS, ROUTINE W REFLEX MICROSCOPIC
BILIRUBIN URINE: NEGATIVE
Glucose, UA: 500 mg/dL — AB
Hgb urine dipstick: NEGATIVE
Ketones, ur: 15 mg/dL — AB
Leukocytes, UA: NEGATIVE
NITRITE: NEGATIVE
Protein, ur: 30 mg/dL — AB
SPECIFIC GRAVITY, URINE: 1.038 — AB (ref 1.005–1.030)
pH: 5.5 (ref 5.0–8.0)

## 2016-01-22 LAB — CBC
HCT: 46.8 % (ref 39.0–52.0)
HEMOGLOBIN: 15.7 g/dL (ref 13.0–17.0)
MCH: 29.3 pg (ref 26.0–34.0)
MCHC: 33.5 g/dL (ref 30.0–36.0)
MCV: 87.5 fL (ref 78.0–100.0)
Platelets: 185 10*3/uL (ref 150–400)
RBC: 5.35 MIL/uL (ref 4.22–5.81)
RDW: 12.8 % (ref 11.5–15.5)
WBC: 7.4 10*3/uL (ref 4.0–10.5)

## 2016-01-22 NOTE — ED Notes (Signed)
Pt. reports intermittent low abdominal pain onset Sunday this week , denies emesis or diarrhea , no fever or chills.

## 2016-01-23 ENCOUNTER — Emergency Department (HOSPITAL_COMMUNITY): Payer: Self-pay

## 2016-01-23 ENCOUNTER — Emergency Department (HOSPITAL_COMMUNITY)
Admission: EM | Admit: 2016-01-23 | Discharge: 2016-01-23 | Disposition: A | Discharge status: Home / Self Care | Payer: Self-pay | Attending: Emergency Medicine | Admitting: Emergency Medicine

## 2016-01-23 ENCOUNTER — Encounter (HOSPITAL_COMMUNITY): Payer: Self-pay | Admitting: Radiology

## 2016-01-23 DIAGNOSIS — A6000 Herpesviral infection of urogenital system, unspecified: Secondary | ICD-10-CM

## 2016-01-23 DIAGNOSIS — R1030 Lower abdominal pain, unspecified: Secondary | ICD-10-CM

## 2016-01-23 LAB — GC/CHLAMYDIA PROBE AMP (~~LOC~~) NOT AT ARMC
CHLAMYDIA, DNA PROBE: NEGATIVE
NEISSERIA GONORRHEA: NEGATIVE

## 2016-01-23 LAB — COMPREHENSIVE METABOLIC PANEL
ALBUMIN: 4 g/dL (ref 3.5–5.0)
ALT: 105 U/L — AB (ref 17–63)
AST: 56 U/L — ABNORMAL HIGH (ref 15–41)
Alkaline Phosphatase: 67 U/L (ref 38–126)
Anion gap: 9 (ref 5–15)
BUN: 12 mg/dL (ref 6–20)
CO2: 26 mmol/L (ref 22–32)
CREATININE: 0.81 mg/dL (ref 0.61–1.24)
Calcium: 9.2 mg/dL (ref 8.9–10.3)
Chloride: 103 mmol/L (ref 101–111)
GFR calc non Af Amer: >60 mL/min (ref >60)
GLUCOSE: 263 mg/dL — AB (ref 65–99)
Potassium: 3.9 mmol/L (ref 3.5–5.1)
SODIUM: 138 mmol/L (ref 135–145)
Total Bilirubin: 0.6 mg/dL (ref 0.3–1.2)
Total Protein: 7.1 g/dL (ref 6.5–8.1)

## 2016-01-23 LAB — LIPASE, BLOOD: LIPASE: 27 U/L (ref 11–51)

## 2016-01-23 LAB — URINE MICROSCOPIC-ADD ON

## 2016-01-23 MED ORDER — OXYCODONE-ACETAMINOPHEN 5-325 MG PO TABS
1.0000 | ORAL_TABLET | Freq: Once | ORAL | Status: AC
  Administered 2016-01-23: 1 via ORAL
  Filled 2016-01-23: qty 1

## 2016-01-23 MED ORDER — ACYCLOVIR 400 MG PO TABS: 200.0000 mg | ORAL_TABLET | Freq: Every day | ORAL | Status: DC

## 2016-01-23 MED ORDER — AZITHROMYCIN 250 MG PO TABS
1000.0000 mg | ORAL_TABLET | Freq: Once | ORAL | Status: AC
  Administered 2016-01-23: 1000 mg via ORAL
  Filled 2016-01-23: qty 4

## 2016-01-23 MED ORDER — CEFTRIAXONE SODIUM 250 MG IJ SOLR
250.0000 mg | Freq: Once | INTRAMUSCULAR | Status: AC
  Administered 2016-01-23: 250 mg via INTRAMUSCULAR
  Filled 2016-01-23: qty 250

## 2016-01-23 NOTE — ED Provider Notes (Signed)
CSN: [id number]     Arrival date & time 01/22/16  2316 History   By signing my name below, I, Arlan OrganAshley Leger, attest that this documentation has been prepared under the direction and in the presence of Shon Batonourtney F Chukwuebuka Churchill, MD.  Electronically Signed: Arlan OrganAshley Leger, ED Scribe. 01/23/2016. 3:06 AM.   Chief Complaint  Patient presents with  . Abdominal Pain   The history is provided by the patient. A language interpreter was used.    HPI Comments: Karo Arrie Easternlonso Kyllonen is a 35 y.o. male with a PMHx of DM who presents to the Emergency Department complaining of intermittent, ongoing lower abdominal pain x 2 days. Currently pain rated 5/10. No aggravating or alleviating factors reported. Pt also reports dysuria and "bumps" to groin area. No OTC medications or home remedies attempted prior to arrival. No recent fever, chills, nausea, vomiting, chest pain, diarrhea, or shortness of breath. Pt denies any new sexual partners. However, he does have concern for STIs and admits to having unprotected sex. He does not take any every day medications. No known allergies to medications.  PCP: No PCP Per Patient    Past Medical History  Diagnosis Date  . Diabetes mellitus without complication (HCC)    History reviewed. No pertinent past surgical history. Family History  Problem Relation Age of Onset  . Diabetes Mother    Social History  Substance Use Topics  . Smoking status: Never Smoker   . Smokeless tobacco: None  . Alcohol Use: No    Review of Systems  Constitutional: Negative for fever and chills.  Gastrointestinal: Positive for abdominal pain. Negative for nausea, vomiting and diarrhea.  Genitourinary: Positive for dysuria.  Neurological: Negative for headaches.  Psychiatric/Behavioral: Negative for confusion.  All other systems reviewed and are negative.     Allergies  Review of patient's allergies indicates no known allergies.  Home Medications   Prior to Admission medications    Medication Sig Start Date End Date Taking? Authorizing Provider  metFORMIN (GLUCOPHAGE) 1000 MG tablet Take 1 tablet (1,000 mg total) by mouth 2 (two) times daily with a meal. 06/12/15  Yes Thao P Le, DO  pravastatin (PRAVACHOL) 40 MG tablet Take 1 tablet (40 mg total) by mouth daily. 06/12/15  Yes Thao P Le, DO  acyclovir (ZOVIRAX) 400 MG tablet Take 0.5 tablets (200 mg total) by mouth 5 (five) times daily. 01/23/16   Shon Batonourtney F Micalah Cabezas, MD  ibuprofen (ADVIL,MOTRIN) 600 MG tablet Take 1 tablet (600 mg total) by mouth every 6 (six) hours as needed. Patient not taking: Reported on 03/14/2015 02/12/15   Loren Raceravid Yelverton, MD   Triage Vitals: BP 129/88 mmHg  Pulse 82  Temp(Src) 97.9 F (36.6 C) (Oral)  Resp 15  Ht 5\' 9"  (1.753 m)  Wt 254 lb 8 oz (115.44 kg)  BMI 37.57 kg/m2  SpO2 93%   Physical Exam  Constitutional: He is oriented to person, place, and time. He appears well-developed and well-nourished. No distress.  HENT:  Head: Normocephalic and atraumatic.  Cardiovascular: Normal rate, regular rhythm and normal heart sounds.   No murmur heard. Pulmonary/Chest: Effort normal and breath sounds normal. No respiratory distress. He has no wheezes.  Abdominal: Soft. Bowel sounds are normal. He exhibits no mass. There is no tenderness. There is no rebound and no guarding.  Genitourinary:  Uncircumcised penis, small vesicles noted over the glans the penis and the distal foreskin, no ulcerative lesions noted, no discharge  Musculoskeletal: He exhibits no edema.  Neurological: He is  alert and oriented to person, place, and time.  Skin: Skin is warm and dry.  Psychiatric: He has a normal mood and affect.  Nursing note and vitals reviewed.   ED Course  Procedures (including critical care time)  DIAGNOSTIC STUDIES: Oxygen Saturation is 99% on RA, Normal by my interpretation.    COORDINATION OF CARE: 2:38 AM- Will give Percocet, Rocephin, and Zithromax. Discussed treatment plan with pt at bedside  and pt agreed to plan.     Labs Review Labs Reviewed  COMPREHENSIVE METABOLIC PANEL - Abnormal; Notable for the following:    Glucose, Bld 263 (*)    AST 56 (*)    ALT 105 (*)    All other components within normal limits  URINALYSIS, ROUTINE W REFLEX MICROSCOPIC (NOT AT [facility]) - Abnormal; Notable for the following:    Specific Gravity, Urine 1.038 (*)    Glucose, UA 500 (*)    Ketones, ur 15 (*)    Protein, ur 30 (*)    All other components within normal limits  URINE MICROSCOPIC-ADD ON - Abnormal; Notable for the following:    Squamous Epithelial / LPF 0-5 (*)    Bacteria, UA FEW (*)    Crystals CA OXALATE CRYSTALS (*)    All other components within normal limits  LIPASE, BLOOD  CBC  GC/CHLAMYDIA PROBE AMP ([place]) NOT AT [facility]    Imaging Review Ct Renal Stone Study  01/23/2016  CLINICAL DATA:  Intermittent lower abdominal pain for 3 days. History of diabetes. EXAM: CT ABDOMEN AND PELVIS WITHOUT CONTRAST TECHNIQUE: Multidetector CT imaging of the abdomen and pelvis was performed following the standard protocol without IV contrast. COMPARISON:  None. FINDINGS: LUNG BASES: Included view of the lung bases are clear. The visualized heart and pericardium are unremarkable. KIDNEYS/BLADDER: Kidneys are orthotopic, demonstrating normal size and morphology. No nephrolithiasis, hydronephrosis; limited assessment for renal masses on this nonenhanced examination. The unopacified ureters are normal in course and caliber. Urinary bladder is partially distended and unremarkable. SOLID ORGANS: The liver is diffusely hypodense compatible with steatosis with mild focal fatty sparing about the gallbladder fossa. Spleen, gallbladder, pancreas and adrenal glands are unremarkable for this non-contrast examination. GASTROINTESTINAL TRACT: The stomach, small and large bowel are normal in course and caliber without inflammatory changes, the sensitivity may be decreased by lack of enteric contrast. Normal  appendix. PERITONEUM/RETROPERITONEUM: Aortoiliac vessels are normal in course and caliber. No lymphadenopathy by CT size criteria. Prostate size is normal. No intraperitoneal free fluid nor free air. SOFT TISSUES/ OSSEOUS STRUCTURES: Nonsuspicious. Degenerative change of spine results in moderate to severe LEFT T10-11 neural foraminal narrowing. IMPRESSION: No urolithiasis, obstructive uropathy nor acute intra-abdominal/ pelvic process. Normal appendix. Hepatic steatosis. Electronically Signed   By: Awilda Metro M.D.   On: 01/23/2016 04:18   I have personally reviewed and evaluated these images and lab results as part of my medical decision-making.   EKG Interpretation None      MDM   Final diagnoses:  Genital herpes  Lower abdominal pain    Patient presents with abdominal pain and concerns for new lesions on the penis. Lesions are characteristic of herpes. He reports unprotected sex. Patient was tested for [facility]. He was empirically treated. Regarding his abdominal pain is been indolent and waxing and waning. He has no tenderness on exam. Given urinary symptoms, kidney stones is consideration. CT scan obtained and reassuring without any evidence of stones or any other intra-abdominal pathology. Patient did vomit once after taking medications for  STDs.  Otherwise he remains nontoxic. Will discharge home with acyclovir. He has a follow-up appointment on April 13.  After history, exam, and medical workup I feel the patient has been appropriately medically screened and is safe for discharge home. Pertinent diagnoses were discussed with the patient. Patient was given return precautions.  I personally performed the services described in this documentation, which was scribed in my presence. The recorded information has been reviewed and is accurate.   Shon Baton, MD 01/23/16 [phone]

## 2016-01-23 NOTE — Discharge Instructions (Signed)
Dolor abdominal en adultos (Abdominal Pain, Adult) El dolor puede tener muchas causas. Normalmente la causa del dolor abdominal no es una enfermedad y Scientist, clinical (histocompatibility and immunogenetics)mejorar sin TEFL teachertratamiento. Frecuentemente puede controlarse y tratarse en casa. Su mdico le Medical sales representativerealizar un examen fsico y posiblemente solicite anlisis de sangre y radiografas para ayudar a Chief Strategy Officerdeterminar la gravedad de su dolor. Sin embargo, en IAC/InterActiveCorpmuchos casos, debe transcurrir ms tiempo antes de que se pueda Clinical research associateencontrar una causa evidente del dolor. Antes de llegar a ese punto, es posible que su mdico no sepa si necesita ms pruebas o un tratamiento ms profundo. INSTRUCCIONES PARA EL CUIDADO EN EL HOGAR  Est atento al dolor para ver si hay cambios. Las siguientes indicaciones ayudarn a Architectural technologistaliviar cualquier molestia que pueda sentir:  [place] solo medicamentos de venta libre o recetados, segn las indicaciones del mdico.  No tome laxantes a menos que se lo haya indicado su mdico.  Pruebe con Neomia Dearuna dieta lquida absoluta (caldo, t o agua) segn se lo indique su mdico. Introduzca gradualmente una dieta normal, segn su tolerancia. SOLICITE ATENCIN MDICA SI:  Tiene dolor abdominal sin explicacin.  Tiene dolor abdominal relacionado con nuseas o diarrea.  Tiene dolor cuando orina o defeca.  Experimenta dolor abdominal que lo despierta de noche.  Tiene dolor abdominal que empeora o mejora cuando come alimentos.  Tiene dolor abdominal que empeora cuando come alimentos grasosos.  Tiene fiebre. SOLICITE ATENCIN MDICA DE INMEDIATO SI:   El dolor no desaparece en un plazo mximo de 2horas.  No deja de (vomitar).  El Engineer, miningdolor se siente solo en partes del abdomen, como el lado derecho o la parte inferior izquierda del abdomen.  Evaca materia fecal sanguinolenta o negra, de aspecto alquitranado. ASEGRESE DE QUE:  Comprende estas instrucciones.  Controlar su afeccin.  Recibir ayuda de inmediato si no mejora o si empeora.   Esta  informacin no tiene Theme park managercomo fin reemplazar el consejo del mdico. Asegrese de hacerle al mdico cualquier pregunta que tenga.   Document Released: 09/29/2005 Document Revised: 10/20/2014 Elsevier Interactive Patient Education 2016 Elsevier Inc. Herpes genital (Genital Herpes) El herpes genital es una enfermedad de transmisin sexual (ETS) comn causada por un virus. El virus se propaga de Neomia Dearuna persona a otra a travs del contacto sexual. La infeccin puede causar picazn, ampollas y llagas en la zona genital o rectal. Esto se denomina erupcin. Se presenta en hombres y mujeres. El herpes genital es particularmente preocupante para las mujeres embarazadas porque el virus puede transmitirse al beb durante el parto y causar problemas graves. El herpes genital tambin es una preocupacin para las personas con un sistema de defensa (inmunitario) debilitado. Los sntomas del herpes genital pueden durar varios das y Tech Data Corporationluego desaparecer. Sin embargo, el virus NVR Incpermanece en el cuerpo, de modo que es posible que tenga ms erupciones en el futuro. El Bank of Americatiempo entre las erupciones vara: pueden transcurrir meses o aos. CAUSAS El herpes genital es causado por el virus del herpes simple (VHS) tipo2 o el VHS tipo1. Estos virus son contagiosos y, por lo general, se transmiten a travs del contacto sexual con Neomia Dearuna persona infectada. El contacto sexual incluye sexo vaginal, anal y oral. FACTORES DE RIESGO Los factores de riesgo de herpes genital incluyen:  Ser sexualmente activo con mltiples parejas.  Mantener relaciones sexuales sin proteccin. SIGNOS Y SNTOMAS Entre los sntomas se pueden incluir los siguientes:  Dolor y picazn en la zona genital o rectal.  Pequeos bultos rojos que se convierten en ampollas y Clinical cytogeneticistluego en llagas.  Sntomas  similares a los de la gripe, como:  [place].  Dolores [employer] cuerpo.  Dolor al Beatrix Shipper.  Flujo vaginal. DIAGNSTICO El herpes genital se puede diagnosticar  mediante:  Examen fsico.  Anlisis de sangre.  Prueba de cultivo del lquido de Heritage manager. TRATAMIENTO No hay cura para el herpes genital. Se pueden usar medicamentos antivirales orales para acelerar la curacin y ayudar a Automotive engineer que los sntomas vuelvan a Research officer, trade union. Estos medicamentos tambin ayudan a Immunologist del virus a las Chief Strategy Officer. INSTRUCCIONES PARA EL CUIDADO EN EL HOGAR  Mantenga las zonas afectadas secas y limpias.  Tome los medicamentos solamente como se lo haya indicado el mdico.  No tenga contacto sexual durante infecciones activas. El herpes genital es contagioso.  Practique el sexo seguro. Los preservativos de ltex y los preservativos femeninos pueden ayudar a Multimedia programmer contagio del virus del herpes.  Evite frotar o tocar las ampollas y las llagas. Si lo hace:  Lvese bien las manos.  No se toque los ojos despus de ello.  Si queda embarazada, informe a su mdico si ha tenido herpes genital.  Concurra a todas las visitas de control como se lo haya indicado el mdico. Esto es importante. PREVENCIN  Use preservativos. Aunque una persona puede contraer herpes genital durante el contacto sexual incluso con el uso de un preservativo, el preservativo puede brindar cierta proteccin.  Evite tener mltiples parejas sexuales.  Hable con su pareja sexual acerca de los sntomas y los antecedentes que cualquiera de los [place] [address].  Hgase pruebas antes de [employer]. Pdale a su pareja que haga lo mismo.  Reconozca los sntomas de herpes genital. No tenga contacto sexual si nota estos sntomas. SOLICITE ATENCIN MDICA SI:  Los sntomas no mejoran con los [employer].  Los sntomas vuelven a Research officer, trade union.  Aparecen nuevos sntomas.  Tiene fiebre.  Siente dolor abdominal.  Presenta enrojecimiento, hinchazn o dolor en el ojo. ASEGRESE DE QUE:  Comprende estas instrucciones.  Controlar su afeccin.  Recibir ayuda de  inmediato si no mejora o si empeora.   Esta informacin no tiene Theme park manager el consejo del mdico. Asegrese de hacerle al mdico cualquier pregunta que tenga.   Document Released: 07/09/2005 Document Revised: 10/20/2014 Elsevier Interactive Patient Education [employer]

## 2016-06-12 ENCOUNTER — Ambulatory Visit (INDEPENDENT_AMBULATORY_CARE_PROVIDER_SITE_OTHER): Payer: Self-pay | Admitting: Physician Assistant

## 2016-06-12 VITALS — BP 126/80 | HR 89 | Temp 99.6°F | Resp 17 | Ht 69.0 in | Wt 248.8 lb

## 2016-06-12 DIAGNOSIS — E785 Hyperlipidemia, unspecified: Secondary | ICD-10-CM

## 2016-06-12 DIAGNOSIS — E119 Type 2 diabetes mellitus without complications: Secondary | ICD-10-CM

## 2016-06-12 DIAGNOSIS — Z23 Encounter for immunization: Secondary | ICD-10-CM

## 2016-06-12 LAB — LIPID PANEL
CHOL/HDL RATIO: 4.7 ratio (ref <=5.0)
CHOLESTEROL: 160 mg/dL (ref 125–200)
HDL: 34 mg/dL — AB (ref >=40)
LDL Cholesterol: 98 mg/dL (ref <130)
Triglycerides: 140 mg/dL (ref <150)
VLDL: 28 mg/dL (ref <30)

## 2016-06-12 LAB — POCT GLYCOSYLATED HEMOGLOBIN (HGB A1C): Hemoglobin A1C: 7

## 2016-06-12 LAB — COMPLETE METABOLIC PANEL WITH GFR
ALBUMIN: 4.7 g/dL (ref 3.6–5.1)
ALK PHOS: 59 U/L (ref 40–115)
ALT: 57 U/L — ABNORMAL HIGH (ref 9–46)
AST: 27 U/L (ref 10–40)
BUN: 14 mg/dL (ref 7–25)
CALCIUM: 9.5 mg/dL (ref 8.6–10.3)
CHLORIDE: 102 mmol/L (ref 98–110)
CO2: 25 mmol/L (ref 20–31)
Creat: 0.69 mg/dL (ref 0.60–1.35)
GFR, Est African American: >89 mL/min (ref >=60)
Glucose, Bld: 186 mg/dL — ABNORMAL HIGH (ref 65–99)
POTASSIUM: 4.2 mmol/L (ref 3.5–5.3)
SODIUM: 137 mmol/L (ref 135–146)
Total Bilirubin: 0.7 mg/dL (ref 0.2–1.2)
Total Protein: 7.5 g/dL (ref 6.1–8.1)

## 2016-06-12 LAB — MICROALBUMIN, URINE: Microalb, Ur: 3.1 mg/dL

## 2016-06-12 MED ORDER — METFORMIN HCL 1000 MG PO TABS: 1000.0000 mg | ORAL_TABLET | Freq: Two times a day (BID) | ORAL | 2 refills | Status: DC

## 2016-06-12 NOTE — Patient Instructions (Addendum)
Follow up in 3 months for lab only visit to recheck A1C  La diabetes mellitus y los alimentos (Diabetes Mellitus and Food) Es importante que controle su nivel de azcar en la sangre (glucosa). El nivel de glucosa en sangre depende en gran medida de lo que usted come. Comer alimentos saludables en las cantidades [place] a lo largo del Futures trader, aproximadamente a la misma hora [employer], lo ayudar a Chief Operating Officer su nivel de Event organiser. Tambin puede ayudarlo a retrasar o Fish farm manager de la diabetes mellitus. Comer de [employer] saludable incluso puede ayudarlo a Event organiser de presin arterial y a Barista o Pharmacologist un peso saludable.  Entre las recomendaciones generales para alimentarse y Water quality scientist los alimentos de forma saludable, se incluyen las siguientes:  Respetar las comidas principales y comer colaciones con regularidad. Evitar pasar largos perodos sin comer con el fin de perder peso.  Seguir una dieta que consista principalmente en alimentos de origen vegetal, como frutas, vegetales, frutos secos, legumbres y cereales integrales.  Utilizar mtodos de coccin a baja temperatura, como hornear, en lugar de mtodos de coccin a alta temperatura, como frer en abundante aceite. Trabaje con el nutricionista para aprender a Acupuncturist nutricional de las etiquetas de los alimentos. CMO PUEDEN AFECTARME LOS ALIMENTOS? Carbohidratos Los carbohidratos afectan el nivel de glucosa en sangre ms que cualquier otro tipo de alimento. El nutricionista lo ayudar a Chief Strategy Officer cuntos carbohidratos puede consumir en cada comida y ensearle a contarlos. El recuento de carbohidratos es importante para mantener la glucosa en sangre en un nivel saludable, en especial si utiliza insulina o toma determinados medicamentos para la diabetes mellitus. Alcohol El alcohol puede provocar disminuciones sbitas de la glucosa en sangre (hipoglucemia), en especial si utiliza insulina o toma  determinados medicamentos para la diabetes mellitus. La hipoglucemia es una afeccin que puede poner en peligro la vida. Los sntomas de la hipoglucemia (somnolencia, mareos y Administrator) son similares a los sntomas de haber consumido mucho alcohol.  Si el mdico lo autoriza a beber alcohol, hgalo con moderacin y siga estas pautas:  Las mujeres no deben beber ms de un trago por da, y los hombres no deben beber ms de dos tragos por Futures trader. Un trago es igual a:  12 onzas (355 ml) de cerveza  5 onzas de vino (150 ml) de vino  1,5onzas (45ml) de bebidas espirituosas  No beba con el estmago vaco.  Mantngase hidratado. Beba agua, gaseosas dietticas o t helado sin azcar.  Las gaseosas comunes, los jugos y otros refrescos podran contener muchos carbohidratos y se Heritage manager. QU ALIMENTOS NO SE RECOMIENDAN? Cuando haga las elecciones de alimentos, es importante que recuerde que todos los alimentos son distintos. Algunos tienen menos nutrientes que otros por porcin, aunque podran tener la misma cantidad de caloras o carbohidratos. Es difcil darle al cuerpo lo que necesita cuando consume alimentos con menos nutrientes. Estos son algunos ejemplos de alimentos que debera evitar ya que contienen muchas caloras y carbohidratos, pero pocos nutrientes:  Neurosurgeon trans (la mayora de los alimentos procesados incluyen grasas trans en la etiqueta de Informacin nutricional).  Gaseosas comunes.  Jugos.  Caramelos.  Dulces, como tortas, pasteles, rosquillas y [place].  Comidas fritas. QU ALIMENTOS PUEDO COMER? Consuma alimentos ricos en nutrientes, que nutrirn el cuerpo y lo mantendrn saludable. Los alimentos que debe comer tambin dependern de varios factores, como:  Las caloras que necesita.  Los medicamentos que toma.  Su peso.  El nivel de glucosa  en sangre.  El Lagunanivel de presin arterial.  El nivel de colesterol. Debe consumir una amplia variedad de alimentos,  por ejemplo:  Protenas.  Cortes de Target Corporationcarne magros.  Protenas con bajo contenido de grasas saturadas, como pescado, clara de huevo y frijoles. Evite las carnes procesadas.  Frutas y vegetales.  Frutas y Sports administratorvegetales que pueden ayudar a Chief Operating Officercontrolar los niveles sanguneos de Harrisonglucosa, como Sigelmanzanas, mangos y batatas.  Productos lcteos.  Elija productos lcteos sin grasa o con bajo contenido de [place] Hyde Parkgrasa, como [place], yogur y [place] Groundqueso.  Cereales, panes, pastas y arroz.  Elija cereales integrales, como panes multicereales, avena en grano y arroz integral. Estos alimentos pueden ayudar a controlar la presin arterial.  Rosalin HawkingGrasas.  Alimentos que contengan grasas saludables, como frutos secos, Chartered certified accountantaguacate, aceite de [place]oliva, aceite de canola y pescado. TODOS LOS QUE PADECEN DIABETES MELLITUS TIENEN EL MISMO PLAN DE COMIDAS? Dado que todas las personas que padecen diabetes mellitus son distintas, no hay un solo plan de comidas que funcione para todos. Es muy importante que se rena con un nutricionista que lo ayudar a crear un plan de comidas adecuado para usted.   Esta informacin no tiene Theme park managercomo fin reemplazar el consejo del mdico. Asegrese de hacerle al mdico cualquier pregunta que tenga.   Document Released: 01/06/2008 Document Revised: 10/20/2014 Elsevier Interactive Patient Education Yahoo[employer] Inc2016 Elsevier Inc.   IF you received an x-ray today, you will receive an invoice from [facility]. Please contact [facility] at [phone] with questions or concerns regarding your invoice.   IF you received labwork today, you will receive an invoice from [facility]. Please contact [facility] at [phone] with questions or concerns regarding your invoice.   Our billing staff will not be able to assist you with questions regarding bills from these companies.  You will be contacted with the lab results as soon as they are available. The fastest way to get your results is  to activate your My Chart account. Instructions are located on the last page of this paperwork. If you have not heard from us regarding the results in 2 weeks, please contact this office.

## 2016-06-12 NOTE — Progress Notes (Signed)
MRN: [id number]  Subjective:   Dennis Avila is a 35 y.o. male who presents for follow up of Type 2 diabetes mellitus. He has been out of his medication for 2 days.   Diagnosis was made in 2016. Patient is currently managed with Continued metformin which has been effective. Admits excellent compliance. Denies adverse effects including metallic taste, hypoglycemia, diarrhea, nausea, and vomiting.   Patient is not checking home blood sugars. Home blood sugar records: patient does not check sugars. Current symptoms include none. Patient denies foot ulcerations, increased appetite, paresthesia of the feet, polydipsia, polyuria and visual disturbances. Patient is checking their feet daily. No foot concerns. He has never had a diabetic eye exam eye exam.   Diet has improved. Consists of pork, beans, rice, soda. Notes that he has decreased the amount of food he eats over the past year... Exercise includes none. Denies smoking or alcohol use.   Known diabetic complications: none  Immunizations: Flu vaccine: Never,  pneumococal vaccine: Never  Pt needs refill for statin as well. As he only took it for three months because he ran out of prescription nine months ago.   Objective:   PHYSICAL EXAM BP 126/80 (BP Location: Left Arm, Patient Position: Sitting, Cuff Size: Large)   Pulse 89   Temp 99.6 F (37.6 C) (Oral)   Resp 17   Ht 5\' 9"  (1.753 m)   Wt 248 lb 12.8 oz (112.9 kg)   SpO2 96%   BMI 36.74 kg/m   Physical Exam  Constitutional: He is oriented to person, place, and time. He appears well-developed and well-nourished.  HENT:  Head: Normocephalic and atraumatic.  Eyes: Conjunctivae are normal. Pupils are equal, round, and reactive to light.  Neck: Normal range of motion.  Pulmonary/Chest: Effort normal.  Abdominal: Soft. Bowel sounds are normal.  Neurological: He is alert and oriented to person, place, and time.  Skin: Skin is warm and dry.  Hypopigmentation on bilateral  hands and neck   Psychiatric: He has a normal mood and affect.  Vitals reviewed.   Diabetic Foot Exam - Simple   Simple Foot Form Diabetic Foot exam was performed with the following findings:  Yes 06/12/2016  9:11 AM  Visual Inspection No deformities, no ulcerations, no other skin breakdown bilaterally:  Yes Sensation Testing Intact to touch and monofilament testing bilaterally:  Yes Pulse Check Posterior Tibialis and Dorsalis pulse intact bilaterally:  Yes Comments     Results for orders placed or performed in visit on 06/12/16 (from the past 24 hour(s))  POCT glycosylated hemoglobin (Hb A1C)     Status: None   Collection Time: 06/12/16  9:14 AM  Result Value Ref Range   Hemoglobin A1C 7.0     Assessment and Plan :  There are no diagnoses linked to this encounter.  1. Type 2 diabetes mellitus without complication, without long-term current use of insulin (HCC) -Lab only visit in three months for A1C, if >7.0 will consider adding additional medication. Follow up in office in 6 months. -Encouraged dietary and lifestyle modification  - POCT glycosylated hemoglobin (Hb A1C) - COMPLETE METABOLIC PANEL WITH GFR - Microalbumin, urine - HM Diabetes Foot Exam - Pneumococcal polysaccharide vaccine 23-valent greater than or equal to 2yo subcutaneous/IM - Hemoglobin A1c; Future - metFORMIN (GLUCOPHAGE) 1000 MG tablet; Take 1 tablet (1,000 mg total) by mouth 2 (two) times daily with a meal.  Dispense: 180 tablet; Refill: 2  2. Hyperlipidemia Await lab results before putting in refill for  statin - Lipid panel  3. Need for influenza vaccination - Flu Vaccine QUAD 36+ mos IM  4. Need for pneumococcal vaccination - Pneumococcal polysaccharide vaccine 23-valent greater than or equal to 2yo subcutaneous/IM  Benjiman CoreBrittany Erica Osuna, PA-C  [facility] 06/12/2016 10:29 AM

## 2016-06-13 ENCOUNTER — Telehealth: Payer: Self-pay | Admitting: Physician Assistant

## 2016-06-13 MED ORDER — ATORVASTATIN CALCIUM 10 MG PO TABS: 10.0000 mg | ORAL_TABLET | Freq: Every day | ORAL | 3 refills | Status: AC

## 2016-06-13 NOTE — Telephone Encounter (Signed)
Pt contacted of normal lab results. His HDL was slightly below normal. Try and incorporate foods higher in HDL into your diet such as beans, whole grains, fatty fish, high fiber fruits, olive oil, and nuts. Also, because pt has a diagnosis of diabetes,  will give a prescription for moderate intensity statin, Lipitor 10mg , to aid in decreasing risk of heart disease. Pt understands.   Dennis CoreBrittany Deandrew Hoecker, PA-C  [facility] 06/13/2016 6:54 PM

## 2016-12-22 IMAGING — CR DG CHEST 2V
2 series · 2 of 2 positions shown · non-contrast
Comparison: None.

CLINICAL DATA: Nausea and vomiting and dizziness for 1 week.
Left-sided chest pain.

EXAM:
CHEST  2 VIEW

[chest pa]
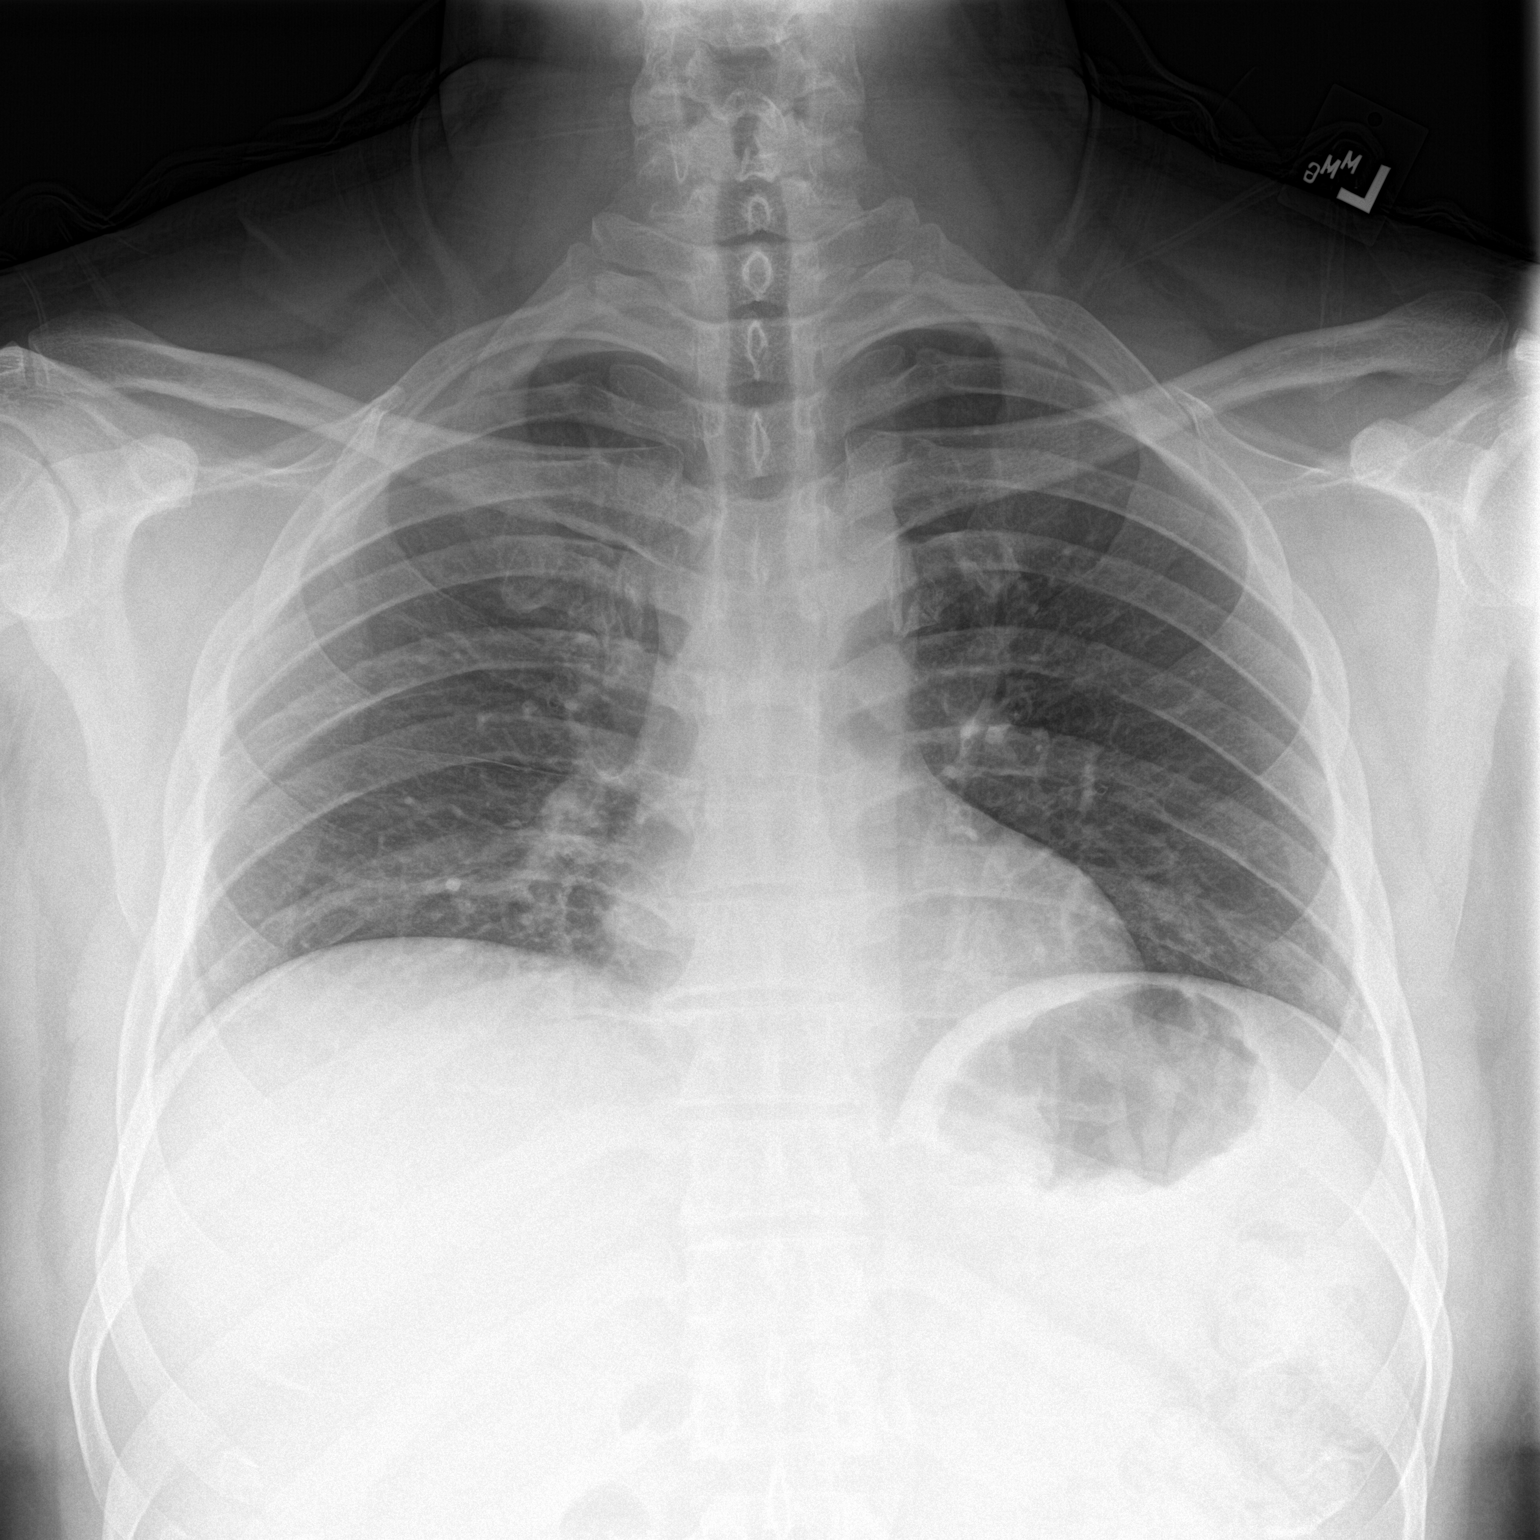

[chest lat]
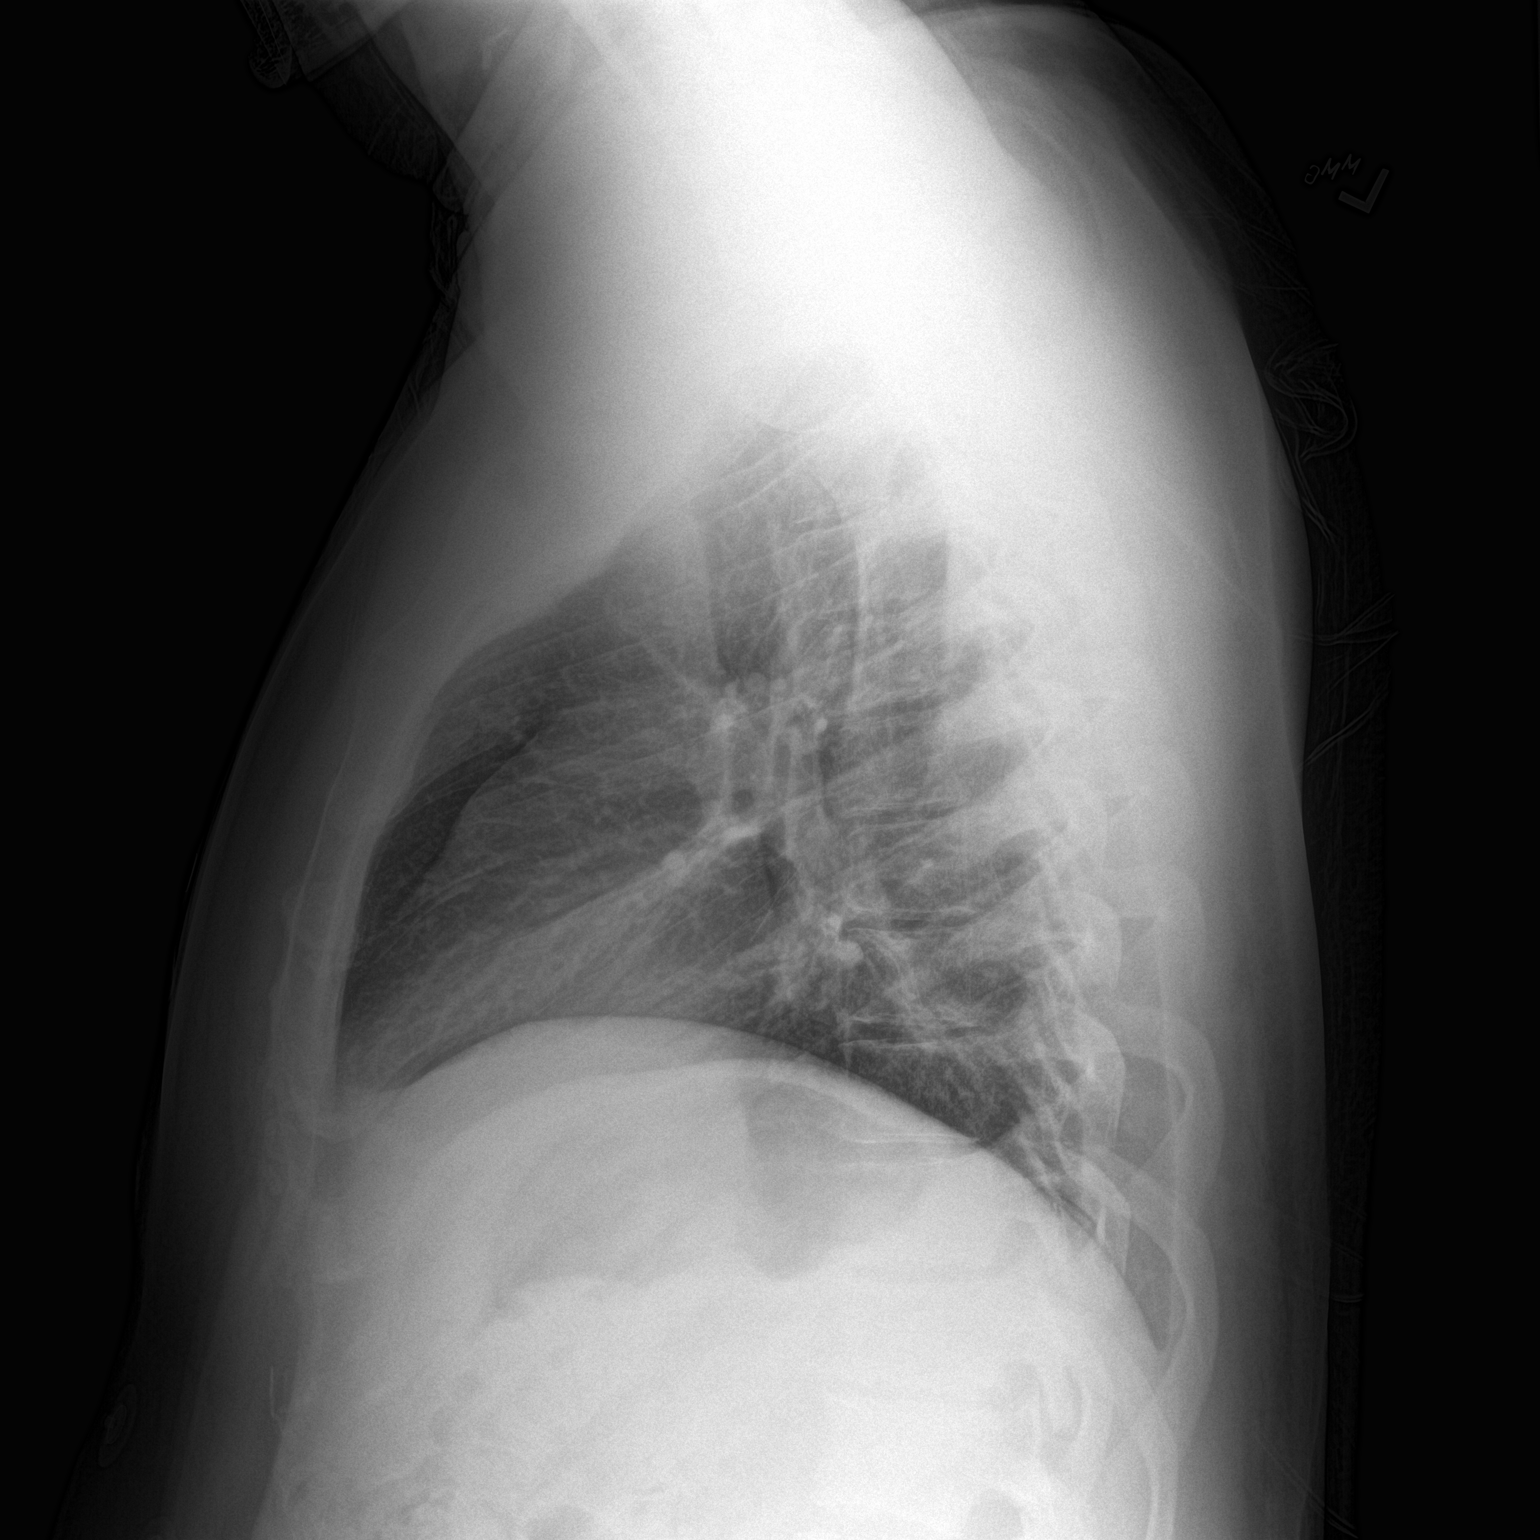

[2 of 2 positions shown; findings below may reference images not displayed]

FINDINGS: Normal cardiac silhouette. Low lung volumes. No effusion,
infiltrate, or pneumothorax.
IMPRESSION: Low lung volumes.  No acute cardiopulmonary process.

## 2017-12-02 IMAGING — CT CT RENAL STONE PROTOCOL
2 of 5 series · 8 of 46 positions shown, 9 images · non-contrast
Comparison: None.

CLINICAL DATA: Intermittent lower abdominal pain for 3 days.
History of diabetes.

EXAM:
CT ABDOMEN AND PELVIS WITHOUT CONTRAST
TECHNIQUE: Multidetector CT imaging of the abdomen and pelvis was performed
following the standard protocol without IV contrast.

[Series 202: thins, idose (2) · axial · 0.85mm/px · z∈[-756,-317]mm · 5 of 1243 slices shown, 6 images]
[im 134/1243  soft-tissue]
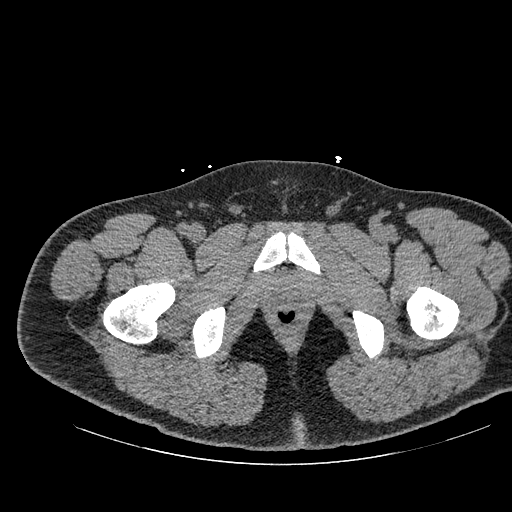
[im 134/1243  bone]
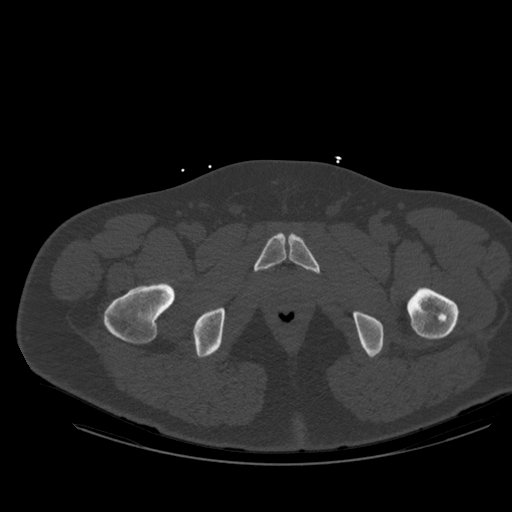
[im 400/1243  soft-tissue]
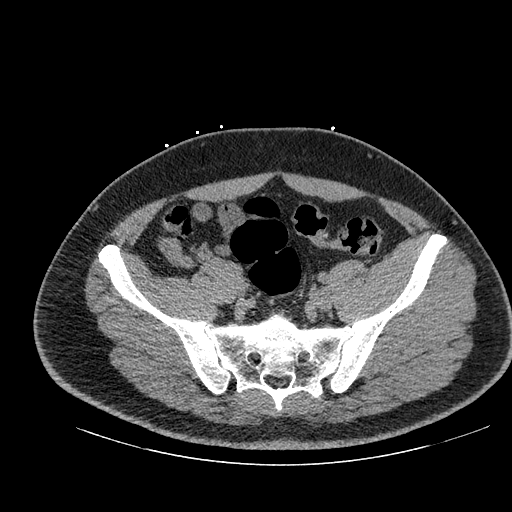
[im 622/1243  soft-tissue]
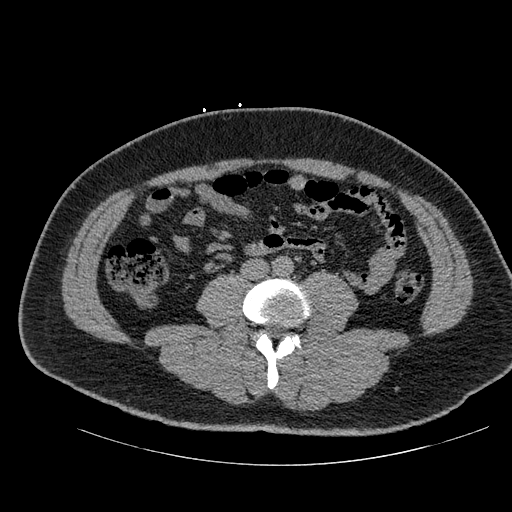
[im 843/1243  soft-tissue]
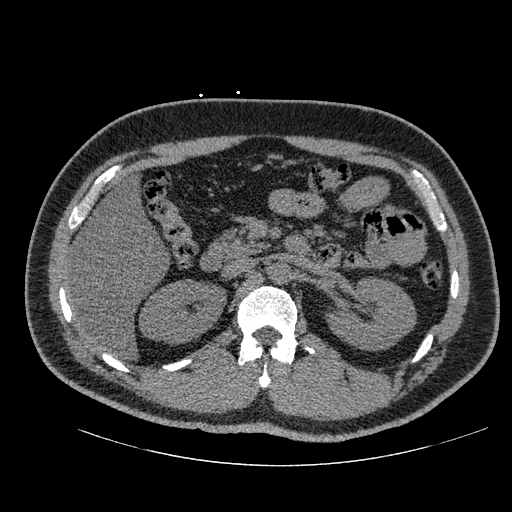
[im 1109/1243  soft-tissue]
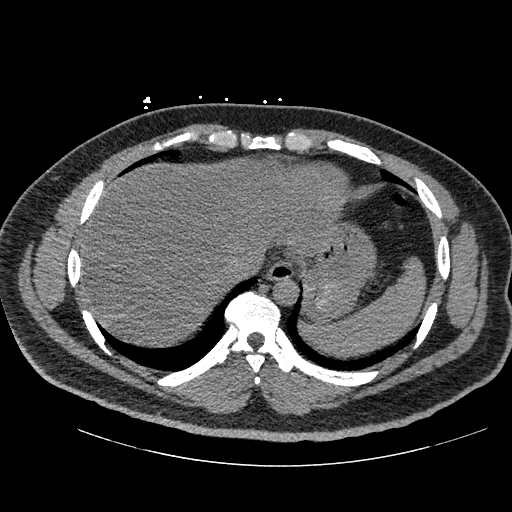

[Series 203: coronals, idose (2) · coronal · 0.45mm/px · 3 of 135 slices shown]
[im 45/135  soft-tissue]
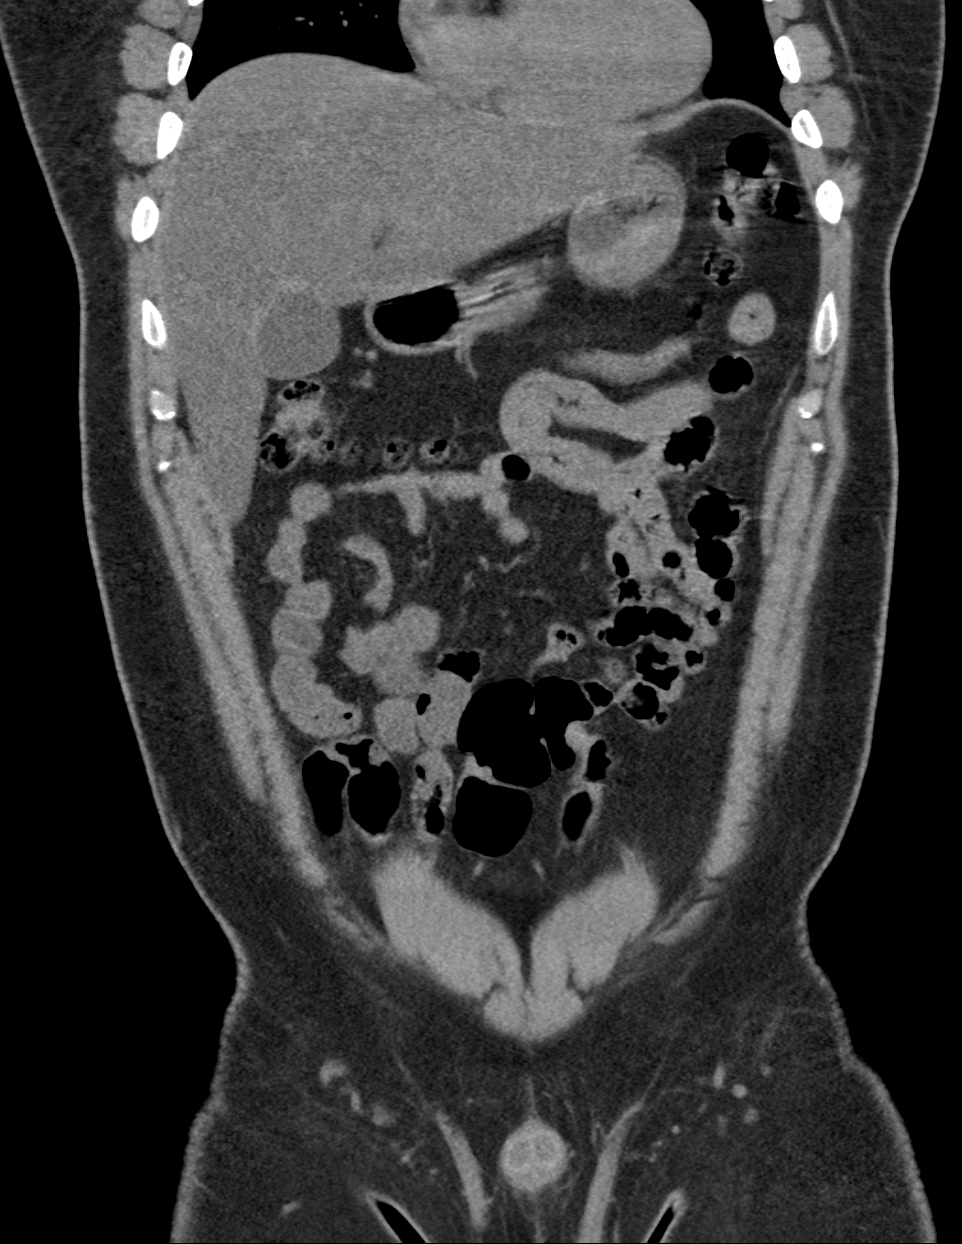
[im 60/135  soft-tissue]
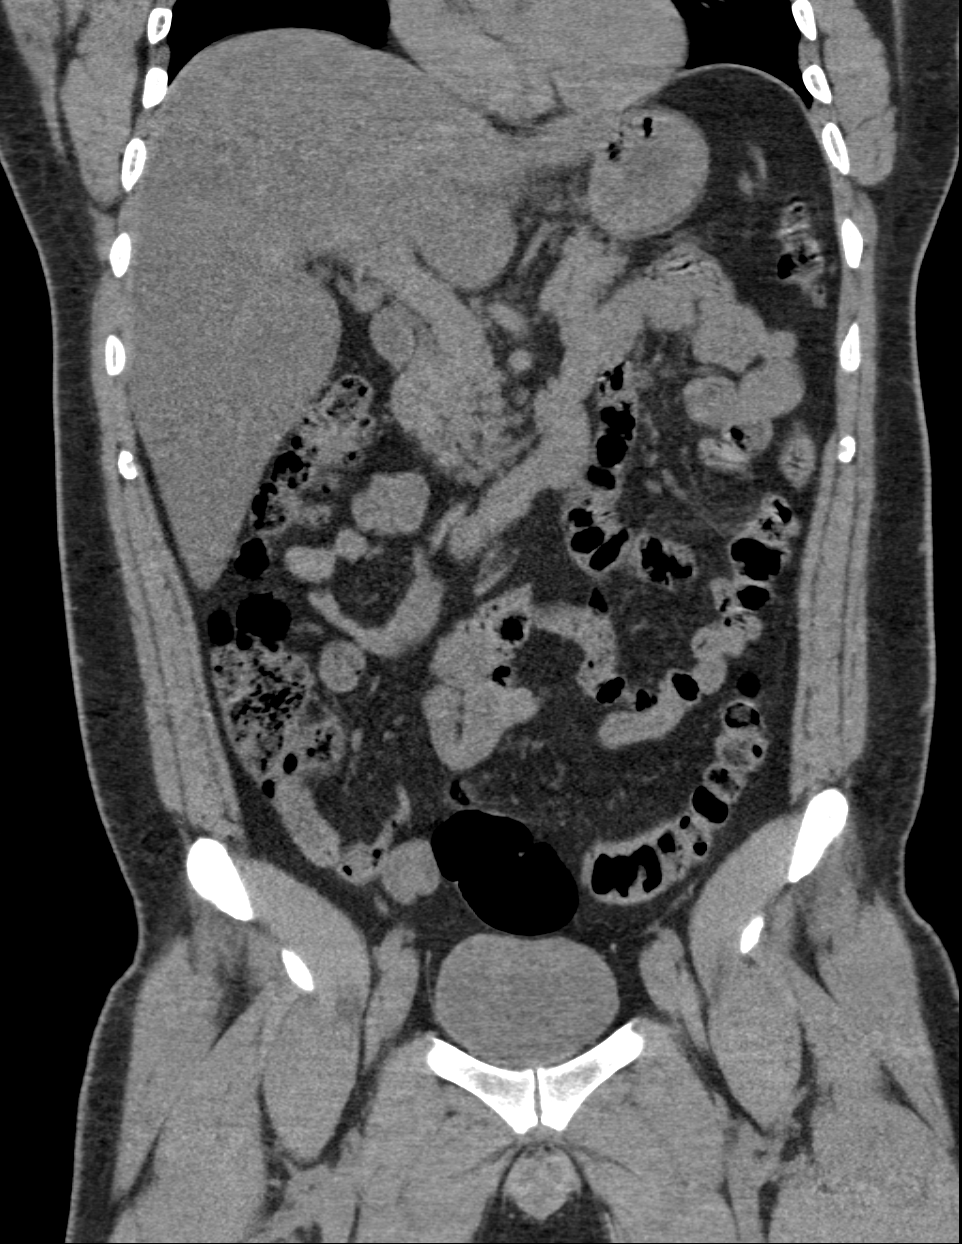
[im 75/135  soft-tissue]
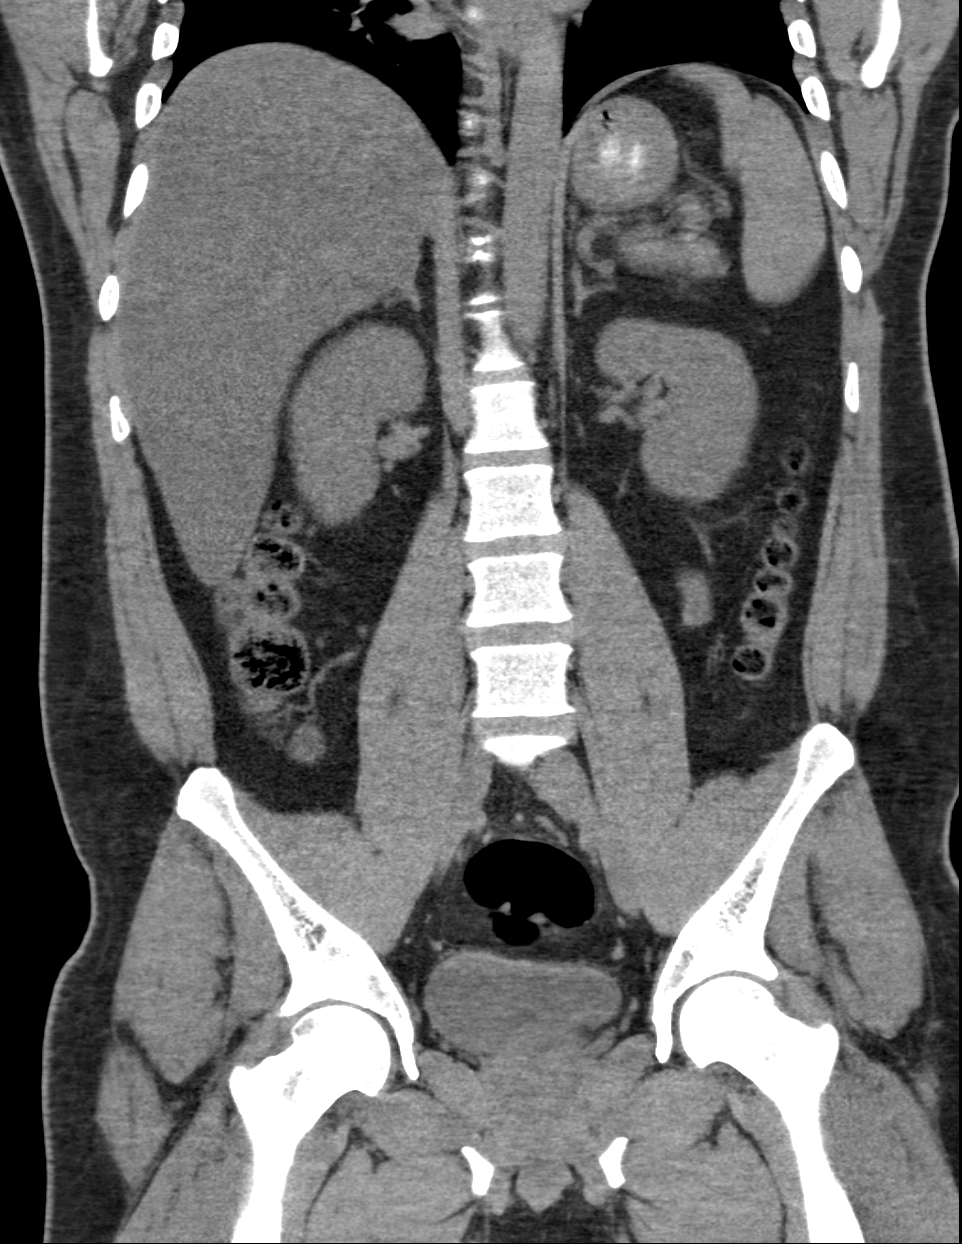

[8 of 46 positions shown; findings below may reference images not displayed]

FINDINGS: LUNG BASES: Included view of the lung bases are clear. The
visualized heart and pericardium are unremarkable.

KIDNEYS/BLADDER: Kidneys are orthotopic, demonstrating normal size
and morphology. No nephrolithiasis, hydronephrosis; limited
assessment for renal masses on this nonenhanced examination. The
unopacified ureters are normal in course and caliber. Urinary
bladder is partially distended and unremarkable.

SOLID ORGANS: The liver is diffusely hypodense compatible with
steatosis with mild focal fatty sparing about the gallbladder fossa.
Spleen, gallbladder, pancreas and adrenal glands are unremarkable
for this non-contrast examination.

GASTROINTESTINAL TRACT: The stomach, small and large bowel are
normal in course and caliber without inflammatory changes, the
sensitivity may be decreased by lack of enteric contrast. Normal
appendix.

PERITONEUM/RETROPERITONEUM: Aortoiliac vessels are normal in course
and caliber. No lymphadenopathy by CT size criteria. Prostate size
is normal. No intraperitoneal free fluid nor free air.

SOFT TISSUES/ OSSEOUS STRUCTURES: Nonsuspicious. Degenerative change
of spine results in moderate to severe LEFT T10-11 neural foraminal
narrowing.
IMPRESSION: No urolithiasis, obstructive uropathy nor acute intra-abdominal/
pelvic process. Normal appendix.

Hepatic steatosis.

## 2021-01-17 ENCOUNTER — Emergency Department (HOSPITAL_COMMUNITY)
Admission: EM | Admit: 2021-01-17 | Discharge: 2021-01-17 | Disposition: A | Discharge status: Home / Self Care | Payer: Self-pay | Attending: Emergency Medicine | Admitting: Emergency Medicine

## 2021-01-17 ENCOUNTER — Other Ambulatory Visit: Payer: Self-pay

## 2021-01-17 DIAGNOSIS — E119 Type 2 diabetes mellitus without complications: Secondary | ICD-10-CM | POA: Insufficient documentation

## 2021-01-17 DIAGNOSIS — N390 Urinary tract infection, site not specified: Secondary | ICD-10-CM | POA: Insufficient documentation

## 2021-01-17 DIAGNOSIS — R310 Gross hematuria: Secondary | ICD-10-CM

## 2021-01-17 DIAGNOSIS — Z7984 Long term (current) use of oral hypoglycemic drugs: Secondary | ICD-10-CM | POA: Insufficient documentation

## 2021-01-17 LAB — CBC
HCT: 48.8 % (ref 39.0–52.0)
Hemoglobin: 16.2 g/dL (ref 13.0–17.0)
MCH: 29.3 pg (ref 26.0–34.0)
MCHC: 33.2 g/dL (ref 30.0–36.0)
MCV: 88.2 fL (ref 80.0–100.0)
Platelets: 200 10*3/uL (ref 150–400)
RBC: 5.53 MIL/uL (ref 4.22–5.81)
RDW: 12.3 % (ref 11.5–15.5)
WBC: 9.5 10*3/uL (ref 4.0–10.5)
nRBC: 0 % (ref 0.0–0.2)

## 2021-01-17 LAB — BASIC METABOLIC PANEL
Anion gap: 7 (ref 5–15)
BUN: 14 mg/dL (ref 6–20)
CO2: 30 mmol/L (ref 22–32)
Calcium: 9.4 mg/dL (ref 8.9–10.3)
Chloride: 99 mmol/L (ref 98–111)
Creatinine, Ser: 0.86 mg/dL (ref 0.61–1.24)
GFR, Estimated: >60 mL/min (ref >60)
Glucose, Bld: 233 mg/dL — ABNORMAL HIGH (ref 70–99)
Potassium: 4 mmol/L (ref 3.5–5.1)
Sodium: 136 mmol/L (ref 135–145)

## 2021-01-17 LAB — URINALYSIS, ROUTINE W REFLEX MICROSCOPIC
Bilirubin Urine: NEGATIVE
Glucose, UA: 50 mg/dL — AB
Ketones, ur: NEGATIVE mg/dL
Nitrite: NEGATIVE
Protein, ur: 30 mg/dL — AB
RBC / HPF: >50 RBC/hpf — ABNORMAL HIGH (ref 0–5)
Specific Gravity, Urine: 1.016 (ref 1.005–1.030)
WBC, UA: >50 WBC/hpf — ABNORMAL HIGH (ref 0–5)
pH: 6 (ref 5.0–8.0)

## 2021-01-17 LAB — HIV ANTIBODY (ROUTINE TESTING W REFLEX): HIV Screen 4th Generation wRfx: NONREACTIVE

## 2021-01-17 MED ORDER — CEFTRIAXONE SODIUM 500 MG IJ SOLR
500.0000 mg | Freq: Once | INTRAMUSCULAR | Status: AC
  Administered 2021-01-17: 500 mg via INTRAMUSCULAR
  Filled 2021-01-17: qty 500

## 2021-01-17 MED ORDER — METFORMIN HCL 1000 MG PO TABS
1000.0000 mg | ORAL_TABLET | Freq: Two times a day (BID) | ORAL | 0 refills | Status: AC
Start: 2021-01-17 — End: ?

## 2021-01-17 MED ORDER — LIDOCAINE HCL (PF) 1 % IJ SOLN
1.0000 mL | Freq: Once | INTRAMUSCULAR | Status: AC
  Administered 2021-01-17: 1 mL
  Filled 2021-01-17: qty 5

## 2021-01-17 MED ORDER — DOXYCYCLINE HYCLATE 100 MG PO CAPS: 100.0000 mg | ORAL_CAPSULE | Freq: Two times a day (BID) | ORAL | 0 refills | Status: AC

## 2021-01-17 NOTE — ED Provider Notes (Signed)
[facility] EMERGENCY DEPARTMENT Provider Note   CSN: [id number] Arrival date & time: 01/17/21  1212     History Chief Complaint  Patient presents with  . Hematuria    Dennis Avila is a 40 y.o. male.  The history is provided by the patient and medical records. The history is limited by a language barrier. A language interpreter was used.  Hematuria     40 year old Hispanic male significant history of diabetes who presents complaining of hematuria.  Patient required language interpreter.  Patient report for the past 7 to 8 days he has had some urinary discomfort.  This started shortly after he had sexual intercourse with his wife.  He noticed some burning sensation while urinating and now has had noticing quite a bit of blood while he urinates.  He was seen by his doctor, was evaluated for symptoms, diagnosed with urinary tract infection and was prescribed amoxicillin.  He has taken the full course of the medication last dose was yesterday.  States that the burning sensation did improve however he continues to notice blood in his urine.  He denies having any fever or chills no back pain no abdominal pain no testicular pain and no rash.  He denies any injury, no history of kidney stone, no penile discharge.  He did have sex with another person 2 months ago and did notice depression has some discharge after sex.  Past Medical History:  Diagnosis Date  . Diabetes mellitus without complication [facility])     Patient Active Problem List   Diagnosis Date Noted  . DM (diabetes mellitus), type 2, uncontrolled (HCC) 03/14/2015  . Dyslipidemia (high LDL; low HDL) 03/14/2015    No past surgical history on file.     Family History  Problem Relation Age of Onset  . Diabetes Mother     Social History   Tobacco Use  . Smoking status: Never Smoker  . Smokeless tobacco: Never Used  Substance Use Topics  . Alcohol use: No  . Drug use: No    Home Medications Prior  to Admission medications   Medication Sig Start Date End Date Taking? Authorizing Provider  atorvastatin (LIPITOR) 10 MG tablet Take 1 tablet (10 mg total) by mouth daily. 06/13/16   Benjiman Core D, PA-C  metFORMIN (GLUCOPHAGE) 1000 MG tablet Take 1 tablet (1,000 mg total) by mouth 2 (two) times daily with a meal. 06/12/16   Benjiman Core D, PA-C    Allergies    Patient has no known allergies.  Review of Systems   Review of Systems  Genitourinary: Positive for hematuria.  All other systems reviewed and are negative.   Physical Exam Updated Vital Signs BP 138/80 (BP Location: Left Arm)   Pulse 87   Temp 98.3 F (36.8 C) (Oral)   Resp 18   SpO2 97%   Physical Exam Vitals and nursing note reviewed.  Constitutional:      General: He is not in acute distress.    Appearance: He is well-developed.  HENT:     Head: Atraumatic.  Eyes:     Conjunctiva/sclera: Conjunctivae normal.  Abdominal:     Palpations: Abdomen is soft.     Tenderness: There is no right CVA tenderness or left CVA tenderness.  Genitourinary:    Comments: [place], RN, available to chaperone.  No inguinal lymphadenopathy or inguinal hernia noted.  Normal uncircumcised penis free of lesion or rash.  Testicles are nontender with normal lie, normal scrotum. No penile discharge  noted. Musculoskeletal:     Cervical back: Neck supple.  Skin:    Findings: No rash.  Neurological:     Mental Status: He is alert. Mental status is at baseline.     ED Results / Procedures / Treatments   Labs (all labs ordered are listed, but only abnormal results are displayed) Labs Reviewed  URINALYSIS, ROUTINE W REFLEX MICROSCOPIC - Abnormal; Notable for the following components:      Result Value   Color, Urine AMBER (*)    APPearance CLOUDY (*)    Glucose, UA 50 (*)    Hgb urine dipstick LARGE (*)    Protein, ur 30 (*)    Leukocytes,Ua LARGE (*)    RBC / HPF >50 (*)    WBC, UA >50 (*)    Bacteria, UA FEW (*)    All  other components within normal limits  BASIC METABOLIC PANEL - Abnormal; Notable for the following components:   Glucose, Bld 233 (*)    All other components within normal limits  CBC  RPR  HIV ANTIBODY (ROUTINE TESTING W REFLEX)  GC/CHLAMYDIA PROBE AMP ([place]) NOT AT [facility]    EKG None  Radiology No results found.  Procedures Procedures   Medications Ordered in ED Medications  cefTRIAXone (ROCEPHIN) injection 500 mg (500 mg Intramuscular Given 01/17/21 1500)  lidocaine (PF) (XYLOCAINE) 1 % injection 1 mL (1 mL Other Given 01/17/21 1501)    ED Course  I have reviewed the triage vital signs and the nursing notes.  Pertinent labs & imaging results that were available during my care of the patient were reviewed by me and considered in my medical decision making (see chart for details).    MDM Rules/Calculators/A&P                          BP 138/80 (BP Location: Left Arm)   Pulse 87   Temp 98.3 F (36.8 C) (Oral)   Resp 18   SpO2 97%   Final Clinical Impression(s) / ED Diagnoses Final diagnoses:  Gross hematuria  Lower urinary tract infectious disease    Rx / DC Orders ED Discharge Orders         Ordered    metFORMIN (GLUCOPHAGE) 1000 MG tablet  2 times daily with meals        01/17/21 1514    doxycycline (VIBRAMYCIN) 100 MG capsule  2 times daily        01/17/21 1514         2:45 PM Patient here with complaints of dysuria and hematuria.  Symptoms ongoing for 8 days, was on amoxicillin for the full course but still endorse having hematuria.  He is overall well-appearing no CVA tenderness to suggest kidney stone.  I did inquire the potential for STI and patient does endorse some risky sexual practices.  Therefore, will obtain STI screening, will give Rocephin and discharged home with doxycycline.  If symptoms persist encourage patient to follow-up with urology for further evaluation of his gross hematuria.  Patient voiced understanding and agrees with plan.   Patient is also on Metformin and have ran out of his medication for the past 2 days.  Will refill.   Fayrene Helper, PA-C 01/17/21 1515    [place], Adam, DO 01/17/21 1523

## 2021-01-17 NOTE — Discharge Instructions (Signed)
You have been evaluated for blood in your urine.  Take antibiotic as treatment for UTI.  If no improvement, follow up with urologist.

## 2021-01-17 NOTE — ED Notes (Signed)
Patient with no adverse s/sx post injection. Patient is alert and ambulatory. DC instructions given to patient with Barbaraann Cao, interpreter via portable ipad. Patient given rx teaching and verbalized understanding. No additional questions.

## 2021-01-17 NOTE — ED Triage Notes (Signed)
Pt reports being seen for hematuria last week, and symptoms have not improved despite taking abx x 1 week. Endorses burning with urination. Also reports hx of diabetes and ran out of his medication two days ago.

## 2021-01-18 LAB — GC/CHLAMYDIA PROBE AMP (~~LOC~~) NOT AT ARMC
Chlamydia: NEGATIVE
Comment: NEGATIVE
Comment: NORMAL
Neisseria Gonorrhea: NEGATIVE

## 2021-01-18 LAB — RPR: RPR Ser Ql: NONREACTIVE

## 2021-04-22 ENCOUNTER — Ambulatory Visit
Admission: EM | Admit: 2021-04-22 | Discharge: 2021-04-22 | Disposition: A | Discharge status: Home / Self Care | Payer: Self-pay | Attending: Family Medicine | Admitting: Family Medicine

## 2021-04-22 ENCOUNTER — Other Ambulatory Visit: Payer: Self-pay

## 2021-04-22 ENCOUNTER — Encounter: Payer: Self-pay | Admitting: Emergency Medicine

## 2021-04-22 DIAGNOSIS — N4889 Other specified disorders of penis: Secondary | ICD-10-CM

## 2021-04-22 DIAGNOSIS — N489 Disorder of penis, unspecified: Secondary | ICD-10-CM

## 2021-04-22 LAB — POCT URINALYSIS DIP (MANUAL ENTRY)
Bilirubin, UA: NEGATIVE
Blood, UA: NEGATIVE
Glucose, UA: 500 mg/dL — AB
Leukocytes, UA: NEGATIVE
Nitrite, UA: NEGATIVE
Protein Ur, POC: NEGATIVE mg/dL
Spec Grav, UA: 1.02 (ref 1.010–1.025)
Urobilinogen, UA: 0.2 E.U./dL
pH, UA: 5.5 (ref 5.0–8.0)

## 2021-04-22 MED ORDER — VALACYCLOVIR HCL 1 G PO TABS: 1000.0000 mg | ORAL_TABLET | Freq: Two times a day (BID) | ORAL | 0 refills | Status: AC

## 2021-04-22 NOTE — ED Triage Notes (Signed)
Rash on penis x 5 days, c/o burning from rash. States has had recent unprotected sex.

## 2021-04-22 NOTE — ED Provider Notes (Addendum)
[facility]    CSN: [id number] Arrival date & time: 04/22/21  7858      History   Chief Complaint Chief Complaint  Patient presents with   SEXUALLY TRANSMITTED DISEASE    HPI Dennis Avila is a 40 y.o. male.   Medical interpreter utilized today to facilitate visit with patient's consent.  Patient presenting today with 5-day history of painful, burning penile rash.  Denies penile discharge, dysuria, hematuria, pelvic pain, fever, chills, nausea, vomiting.  Has unprotected intercourse with same male partner times several years now.  No known history of STIs in the past.  Not try anything over-the-counter for symptoms thus far.   Past Medical History:  Diagnosis Date   Diabetes mellitus without complication [facility])     Patient Active Problem List   Diagnosis Date Noted   DM (diabetes mellitus), type 2, uncontrolled (HCC) 03/14/2015   Dyslipidemia (high LDL; low HDL) 03/14/2015    History reviewed. No pertinent surgical history.     Home Medications    Prior to Admission medications   Medication Sig Start Date End Date Taking? Authorizing Provider  JANUMET 50-1000 MG tablet Take 1 tablet by mouth 2 (two) times daily. 01/15/21  Yes [provider]  valACYclovir (VALTREX) 1000 MG tablet Take 1 tablet (1,000 mg total) by mouth 2 (two) times daily for 7 days. 04/22/21 04/29/21 Yes Particia Nearing, PA-C  atorvastatin (LIPITOR) 10 MG tablet Take 1 tablet (10 mg total) by mouth daily. 06/13/16   Benjiman Core D, PA-C  augmented betamethasone dipropionate (DIPROLENE-AF) 0.05 % cream Apply 1 application topically at bedtime. 01/15/21   [provider]  doxycycline (VIBRAMYCIN) 100 MG capsule Take 1 capsule (100 mg total) by mouth 2 (two) times daily. One po bid x 7 days 01/17/21   Fayrene Helper, PA-C  fenofibrate (TRICOR) 145 MG tablet Take 145 mg by mouth daily. 01/15/21   [provider]  JARDIANCE 10 MG TABS tablet Take 10 mg by mouth  daily. 01/15/21   [provider]  metFORMIN (GLUCOPHAGE) 1000 MG tablet Take 1 tablet (1,000 mg total) by mouth 2 (two) times daily with a meal. 01/17/21   Fayrene Helper, PA-C    Family History Family History  Problem Relation Age of Onset   Diabetes Mother     Social History Social History   Tobacco Use   Smoking status: Never   Smokeless tobacco: Never  Substance Use Topics   Alcohol use: No   Drug use: No   Allergies   Patient has no known allergies.  Review of Systems Review of Systems Per HPI  Physical Exam Triage Vital Signs ED Triage Vitals  Enc Vitals Group     BP 04/22/21 0959 (!) 133/92     Pulse Rate 04/22/21 0959 85     Resp 04/22/21 0959 14     Temp 04/22/21 0959 98 F (36.7 C)     Temp Source 04/22/21 0959 Oral     SpO2 04/22/21 0959 96 %     Weight --      Height --      Head Circumference --      Peak Flow --      Pain Score 04/22/21 1002 1     Pain Loc --      Pain Edu? --      Excl. in GC? --    No data found.  Updated Vital Signs BP (!) 133/92 (BP Location: Left Arm)   Pulse  85   Temp 98 F (36.7 C) (Oral)   Resp 14   SpO2 96%   Visual Acuity Right Eye Distance:   Left Eye Distance:   Bilateral Distance:    Right Eye Near:   Left Eye Near:    Bilateral Near:     Physical Exam Vitals and nursing note reviewed. Exam conducted with a chaperone present.  Constitutional:      Appearance: Normal appearance.  HENT:     Head: Atraumatic.     Mouth/Throat:     Mouth: Mucous membranes are moist.  Eyes:     Extraocular Movements: Extraocular movements intact.     Conjunctiva/sclera: Conjunctivae normal.  Cardiovascular:     Rate and Rhythm: Normal rate and regular rhythm.  Pulmonary:     Effort: Pulmonary effort is normal.     Breath sounds: Normal breath sounds. No wheezing or rales.  Abdominal:     General: Bowel sounds are normal. There is no distension.     Palpations: Abdomen is soft.     Tenderness: There is no  abdominal tenderness. There is no right CVA tenderness, left CVA tenderness or guarding.  Genitourinary:    Comments: Erythematous ulcerations in a cluster present on head of penis, mildly tender to palpation Musculoskeletal:        General: Normal range of motion.     Cervical back: Normal range of motion and neck supple.  Skin:    General: Skin is warm and dry.  Neurological:     General: No focal deficit present.     Mental Status: He is oriented to person, place, and time.  Psychiatric:        Mood and Affect: Mood normal.        Thought Content: Thought content normal.        Judgment: Judgment normal.   UC Treatments / Results  Labs (all labs ordered are listed, but only abnormal results are displayed) Labs Reviewed  POCT URINALYSIS DIP (MANUAL ENTRY) - Abnormal; Notable for the following components:      Result Value   Glucose, UA =500 (*)    Ketones, POC UA small (15) (*)    All other components within normal limits  HSV CULTURE AND TYPING  CYTOLOGY, (ORAL, ANAL, URETHRAL) ANCILLARY ONLY    EKG   Radiology No results found.  Procedures Procedures (including critical care time)  Medications Ordered in UC Medications - No data to display  Initial Impression / Assessment and Plan / UC Course  I have reviewed the triage vital signs and the nursing notes.  Pertinent labs & imaging results that were available during my care of the patient were reviewed by me and considered in my medical decision making (see chart for details).     Symptoms and exam findings suspicious for HSV, cytology swab and HSV swab pending today.  We will refrain from HIV and syphilis testing today as he was just tested in April with negative results for both and has been in a monogamous relationship throughout the duration.  Discussed possible causes today with patient at length, questions answered in full.  Will trial Valtrex and Aquaphor to the area while awaiting these results.  Discussed  abstinence until these results return.  Follow-up with any worsening symptoms in the meantime.  Final Clinical Impressions(s) / UC Diagnoses   Final diagnoses:  Penile lesion  Penile pain     Discharge Instructions      We are not yet sure  what is causing your rash.  We will be in touch as soon as your test results return from the lab and in the meantime we will try some Valtrex to see if this helps your rash.  You may also use Aquaphor or Vaseline to the area to help protect the sensitive areas.  Avoid any sexual contact until your results return and we know exactly what may be going on.  Follow-up if symptoms worsen at any time.     ED Prescriptions     Medication Sig Dispense Auth. Provider   valACYclovir (VALTREX) 1000 MG tablet Take 1 tablet (1,000 mg total) by mouth 2 (two) times daily for 7 days. 14 tablet Particia Nearing, [place]      PDMP not reviewed this encounter.   Particia Nearing, [place] 04/22/21 1131    Roosvelt Maser [place], [place] 04/22/21 1132

## 2021-04-22 NOTE — Discharge Instructions (Addendum)
We are not yet sure what is causing your rash.  We will be in touch as soon as your test results return from the lab and in the meantime we will try some Valtrex to see if this helps your rash.  You may also use Aquaphor or Vaseline to the area to help protect the sensitive areas.  Avoid any sexual contact until your results return and we know exactly what may be going on.  Follow-up if symptoms worsen at any time.

## 2021-04-23 LAB — CYTOLOGY, (ORAL, ANAL, URETHRAL) ANCILLARY ONLY
Chlamydia: NEGATIVE
Comment: NEGATIVE
Comment: NEGATIVE
Comment: NORMAL
Neisseria Gonorrhea: NEGATIVE
Trichomonas: NEGATIVE

## 2021-04-25 LAB — HSV CULTURE AND TYPING

## 2022-03-12 ENCOUNTER — Ambulatory Visit: Payer: Self-pay | Admitting: Critical Care Medicine

## 2022-03-12 ENCOUNTER — Encounter: Payer: Self-pay | Admitting: Critical Care Medicine
